# Patient Record
Sex: Male | Born: 1950 | Race: White | Hispanic: No | Marital: Single | Smoking: Never smoker
Health system: Southern US, Community
[De-identification: ages and names within clinical notes are randomized; demographics above are authoritative.]

## PROBLEM LIST (undated history)

## (undated) DIAGNOSIS — N200 Calculus of kidney: Secondary | ICD-10-CM

## (undated) DIAGNOSIS — I10 Essential (primary) hypertension: Secondary | ICD-10-CM

## (undated) DIAGNOSIS — K635 Polyp of colon: Secondary | ICD-10-CM

## (undated) DIAGNOSIS — K219 Gastro-esophageal reflux disease without esophagitis: Secondary | ICD-10-CM

## (undated) DIAGNOSIS — Q249 Congenital malformation of heart, unspecified: Secondary | ICD-10-CM

## (undated) DIAGNOSIS — F419 Anxiety disorder, unspecified: Secondary | ICD-10-CM

## (undated) DIAGNOSIS — G4730 Sleep apnea, unspecified: Secondary | ICD-10-CM

## (undated) DIAGNOSIS — Z87442 Personal history of urinary calculi: Secondary | ICD-10-CM

## (undated) HISTORY — DX: Calculus of kidney: N20.0

## (undated) HISTORY — DX: Anxiety disorder, unspecified: F41.9

## (undated) HISTORY — PX: NASAL SINUS SURGERY: SHX719

## (undated) HISTORY — DX: Gastro-esophageal reflux disease without esophagitis: K21.9

## (undated) HISTORY — DX: Sleep apnea, unspecified: G47.30

## (undated) HISTORY — DX: Polyp of colon: K63.5

## (undated) HISTORY — PX: OTHER SURGICAL HISTORY: SHX169

## (undated) HISTORY — PX: COLONOSCOPY: SHX174

---
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
## 2002-11-29 ENCOUNTER — Encounter: Payer: Self-pay | Admitting: Gastroenterology

## 2003-02-12 ENCOUNTER — Emergency Department (HOSPITAL_COMMUNITY): Admission: AD | Admit: 2003-02-12 | Discharge: 2003-02-12 | Payer: Self-pay | Admitting: Family Medicine

## 2004-07-26 ENCOUNTER — Ambulatory Visit: Payer: Self-pay | Admitting: Internal Medicine

## 2004-08-17 ENCOUNTER — Encounter: Payer: Self-pay | Admitting: Internal Medicine

## 2004-08-17 ENCOUNTER — Ambulatory Visit (HOSPITAL_BASED_OUTPATIENT_CLINIC_OR_DEPARTMENT_OTHER): Admission: RE | Admit: 2004-08-17 | Discharge: 2004-08-17 | Payer: Self-pay | Admitting: Internal Medicine

## 2004-08-23 ENCOUNTER — Ambulatory Visit: Payer: Self-pay | Admitting: Internal Medicine

## 2004-08-31 ENCOUNTER — Ambulatory Visit: Payer: Self-pay | Admitting: Internal Medicine

## 2004-09-28 ENCOUNTER — Emergency Department (HOSPITAL_COMMUNITY): Admission: EM | Admit: 2004-09-28 | Discharge: 2004-09-28 | Payer: Self-pay | Admitting: Family Medicine

## 2004-10-06 ENCOUNTER — Ambulatory Visit: Payer: Self-pay | Admitting: Internal Medicine

## 2006-03-17 ENCOUNTER — Encounter: Admission: RE | Admit: 2006-03-17 | Discharge: 2006-03-17 | Payer: Self-pay | Admitting: Internal Medicine

## 2006-12-04 ENCOUNTER — Emergency Department (HOSPITAL_COMMUNITY): Admission: EM | Admit: 2006-12-04 | Discharge: 2006-12-04 | Payer: Self-pay | Admitting: Family Medicine

## 2006-12-26 ENCOUNTER — Encounter: Admission: RE | Admit: 2006-12-26 | Discharge: 2006-12-26 | Payer: Self-pay | Admitting: Internal Medicine

## 2007-02-27 ENCOUNTER — Ambulatory Visit: Payer: Self-pay | Admitting: Gastroenterology

## 2007-03-12 ENCOUNTER — Encounter: Payer: Self-pay | Admitting: Gastroenterology

## 2007-03-12 ENCOUNTER — Ambulatory Visit: Payer: Self-pay | Admitting: Gastroenterology

## 2008-11-10 ENCOUNTER — Encounter: Admission: RE | Admit: 2008-11-10 | Discharge: 2008-11-10 | Payer: Self-pay | Admitting: Internal Medicine

## 2009-05-26 ENCOUNTER — Ambulatory Visit: Payer: Self-pay | Admitting: Internal Medicine

## 2009-05-26 DIAGNOSIS — G4700 Insomnia, unspecified: Secondary | ICD-10-CM

## 2009-05-26 DIAGNOSIS — J310 Chronic rhinitis: Secondary | ICD-10-CM

## 2009-05-26 DIAGNOSIS — G4733 Obstructive sleep apnea (adult) (pediatric): Secondary | ICD-10-CM | POA: Insufficient documentation

## 2009-05-26 HISTORY — DX: Obstructive sleep apnea (adult) (pediatric): G47.33

## 2009-05-26 HISTORY — DX: Insomnia, unspecified: G47.00

## 2009-05-26 HISTORY — DX: Chronic rhinitis: J31.0

## 2009-05-31 DIAGNOSIS — J309 Allergic rhinitis, unspecified: Secondary | ICD-10-CM

## 2009-05-31 HISTORY — DX: Allergic rhinitis, unspecified: J30.9

## 2009-06-07 ENCOUNTER — Encounter: Payer: Self-pay | Admitting: Internal Medicine

## 2009-07-04 ENCOUNTER — Telehealth: Payer: Self-pay | Admitting: Internal Medicine

## 2009-10-29 ENCOUNTER — Encounter: Admission: RE | Admit: 2009-10-29 | Discharge: 2009-10-29 | Payer: Self-pay | Admitting: Internal Medicine

## 2010-05-04 NOTE — Progress Notes (Signed)
Summary: [facility] autotitrated CPAP to 10  Phone Note Other Incoming   Summary of Call: CPAP autotitration from [facility] 2/227-06/07/09: 10 cwp for AHI 2.3/hr, Initial call taken by: Waymon Budge MD,  July 06, 2009 1:28 PM    New/Updated Medications: * CPAP 10 SLEEPMED

## 2010-05-04 NOTE — Assessment & Plan Note (Signed)
Summary: sleep problem/ mbw   Primary Provider/Referring Provider:  Dr. Elmore Guise  CC:  Sleep Consult.  Former pt.  wearing cpap everynight for approx 5-6 hours.  states he wakes up with mask off.  .  History of Present Illness: May 26, 2009- 58 yoM referred courtesy of Dr Elmore Guise for help with obstructive sleep apnea.  He was dx'd by NPSG 08/17/04, RDI 13.4/hr. CPAP of 7 corrected to AHI 0/hr.  He was last seen for f/u in 2007.  He got laid off. Has felt tired at night but fights going to bed. He denies daytime sleepiness, but admits he could doze off on cough. Dr Chilton Si gave triazolam for insomnia, used only occasionally.  He lives alone, with no feed-back about snoring, but he can't tell if cpap is making any difference. Bedtime 10-11 PM, Short latency with uncertain waking after sleep onset until up at 530AM.  Preventive Screening-Counseling & Management  Alcohol-Tobacco     Smoking Status: never  Current Medications (verified): 1)  Triazolam 0.25 Mg Tabs (Triazolam) .... At Bedtime As Needed  Allergies (verified): 1)  ! Pcn  Past History:  Family History: Last updated: 05/26/2009 father-emphysema father-asthma  Social History: Last updated: 05/26/2009 Patient never smoked.  no alcohol divorced 1 son lives alon Unemployed at present  Risk Factors: Smoking Status: never (05/26/2009)  Past Medical History: OBSTRUCTIVE SLEEP APNEA (ICD-327.23)- NPSG 08/17/04 RDI 13.4/hr Allergic Rhinitis  Past Surgical History: sinus surgery/ UPPP Nasal polypectomy x 2 Appendectomy  Family History: father-emphysema father-asthma  Social History: Patient never smoked.  no alcohol divorced 1 son lives alon Unemployed at presentSmoking Status:  never  Review of Systems      See HPI       The patient complains of nasal congestion/difficulty breathing through nose.  The patient denies shortness of breath with activity, shortness of breath at rest, productive cough,  non-productive cough, coughing up blood, chest pain, irregular heartbeats, acid heartburn, indigestion, loss of appetite, weight change, abdominal pain, difficulty swallowing, sore throat, tooth/dental problems, headaches, sneezing, itching, ear ache, anxiety, depression, hand/feet swelling, joint stiffness or pain, rash, change in color of mucus, and fever.    Vital Signs:  Patient profile:   60 year old male Height:      73 inches Weight:      208.50 pounds BMI:     27.61 O2 Sat:      96 % on Room air Pulse rate:   96 / minute BP sitting:   140 / 86  (right arm) Cuff size:   regular  Vitals Entered By: Gweneth Dimitri RN (May 26, 2009 4:22 PM)  O2 Flow:  Room air CC: Sleep Consult.  Former pt.  wearing cpap everynight for approx 5-6 hours.  states he wakes up with mask off.   Comments Medications reviewed with patient Daytime contact number verified with patient. Gweneth Dimitri RN  May 26, 2009 4:22 PM    Physical Exam  Additional Exam:  General: A/Ox3; pleasant and cooperative, NAD, trim, calm SKIN: no rash, lesions NODES: no lymphadenopathy HEENT: [place]/AT, EOM- WNL, Conjuctivae- clear, PERRLA, TM-WNL, Nose- narrow left side, no polyps, Throat- clear, s/p UPPP NECK: Supple w/ fair ROM, JVD- none, normal carotid impulses w/o bruits Thyroid- normal to palpation CHEST: Clear to P&A HEART: RRR, no m/g/r heard ABDOMEN: Soft and nl;  MWU:XLKG, nl pulses, no edema  NEURO: Grossly intact to observation      Impression & Recommendations:  Problem # 1:  OBSTRUCTIVE  SLEEP APNEA (ICD-327.23)  He was mild originally, remains trim and has had a UPPP. Aware from girlfriend that he does still snore, but not noting daytime sleepiness. We can get autotitration as a guide and have DME look at his machine and humidifier.  Problem # 2:  INSOMNIA (ICD-780.52) OK to continue triuazolam from Dr Chilton Si for now, but I've introduced cognitive behavioral therapy. His updated medication  list for this problem includes:    Triazolam 0.25 Mg Tabs (Triazolam) .Marland Kitchen... At bedtime as needed  Problem # 3:  RHINITIS (ICD-472.0) We will sample nasonex. I don't see polyps now.  Medications Added to Medication List This Visit: 1)  Triazolam 0.25 Mg Tabs (Triazolam) .... At bedtime as needed  Other Orders: New Patient Level IV (47829) DME Referral (DME)  Patient Instructions: 1)  Please schedule a follow-up appointment in 1 month. 2)  See [facility] to set up CPAP autotitration 3)  Consider cognitive behavioral therapy web site 4)       [email or web address] 5)  Triazolam ok for now 6)  Sample Nasonex nasal spray- 2 puffs each nostril every night.   Immunization History:  Influenza Immunization History:    Influenza:  historical (01/02/2009)

## 2010-08-20 NOTE — Procedures (Signed)
NAME:  Ricardo Morris, Ricardo Morris                  ACCOUNT NO.:  [id number]   MEDICAL RECORD NO.:  [id number]          PATIENT TYPE:  OUT   LOCATION:  SLEEP CENTER                 FACILITY:  [facility]   PHYSICIAN:  Clinton D. Maple Hudson, M.D. DATE OF BIRTH:  Sep 30, 1950   DATE OF STUDY:  08/17/2004                              NOCTURNAL POLYSOMNOGRAM   REFERRING PHYSICIAN:  Dr. Jetty Duhamel   STUDY DATE:  Aug 17, 2004   INDICATION FOR STUDY:  Insomnia with sleep apnea.  Epworth Sleepiness Score  6/24, BMI 26, weight 200 pounds.   SLEEP ARCHITECTURE:  Total sleep time 453 minutes with sleep efficiency 86%.  Stage I was 3%, stage II 62%, stages III and IV were 4% and REM was 31% of  total sleep time.  Sleep latency 25 minutes, REM latency 141 minutes, awake  after sleep onset 42 minutes, arousal index 9.   RESPIRATORY DATA:  Split-study protocol.  Respiratory disturbance index  (RDI, AHI) 13.4 obstructive events per hour indicating mild obstructive  sleep apnea/hypopnea syndrome before CPAP.  There were 10 obstructive  apneas, 1 central apnea and 21 hypopneas before CPAP.  All events and almost  all initial sleep before application of CPAP were recorded while sleeping  supine.   REM RDI 0.  CPAP was titrated to 7 CWP, RDI 0 per hour using a full face  Ultra Mirage Mask with heated humidifier.   OXYGEN DATA:  Mild snoring with oxygen desaturation to a nadir of 89% before  CPAP.  After CPAP control saturation held 95-98% on room air.   CARDIAC DATA:  Normal sinus rhythm.   MOVEMENT/PARASOMNIA:  A total of 322 limb jerks were recorded of which 9  were associated with arousal or awakening for a periodic limb movement with  arousal of 1.2 per hour.   IMPRESSION/RECOMMENDATION:  1.  Mild obstructive sleep apnea/hypopnea syndrome, respiratory disturbance      index 3.4 per hour with mild snoring and oxygen desaturation to 89%.  2.  Successful continuous positive airway pressure titration to 7 CWP,  respiratory disturbance index 0 per hour.  A full face Ultra Mirage mask      was used with heated humidifier.  3.  Periodic limb movement with arousal.  On this study most limb jerks were      not specifically associated with      arousal event but this degree of leg jerking strongly suggests this may      be a clinically significant problem at home.      CDY/MEDQ  D:  08/22/2004 11:12:06  T:  08/22/2004 13:03:32  Job:  [id number]

## 2012-02-27 ENCOUNTER — Encounter: Payer: Self-pay | Admitting: Gastroenterology

## 2012-04-24 ENCOUNTER — Encounter: Payer: Self-pay | Admitting: Gastroenterology

## 2012-05-14 ENCOUNTER — Other Ambulatory Visit: Payer: Self-pay | Admitting: Dermatology

## 2012-06-11 ENCOUNTER — Ambulatory Visit (AMBULATORY_SURGERY_CENTER): Payer: BC Managed Care – PPO | Admitting: *Deleted

## 2012-06-11 VITALS — Ht 73.0 in | Wt 210.0 lb

## 2012-06-11 DIAGNOSIS — Z1211 Encounter for screening for malignant neoplasm of colon: Secondary | ICD-10-CM

## 2012-06-11 MED ORDER — SUPREP BOWEL PREP KIT 17.5-3.13-1.6 GM/177ML PO SOLN: ORAL | Status: DC

## 2012-06-12 ENCOUNTER — Encounter: Payer: Self-pay | Admitting: Gastroenterology

## 2012-06-18 ENCOUNTER — Telehealth: Payer: Self-pay | Admitting: Gastroenterology

## 2012-06-18 NOTE — Telephone Encounter (Signed)
Spoke with patient, he denies any fever just having sinus drainage. Explained to patient that he could still have colonoscopy unless he has fever. He understands. He wants to wait over the next few days to see if he improves before he cancels procedure. He will call us back if needed.

## 2012-06-25 ENCOUNTER — Ambulatory Visit (AMBULATORY_SURGERY_CENTER): Payer: BC Managed Care – PPO | Admitting: Gastroenterology

## 2012-06-25 ENCOUNTER — Encounter: Payer: Self-pay | Admitting: Gastroenterology

## 2012-06-25 VITALS — BP 126/76 | HR 68 | Temp 97.9°F | Resp 9 | Ht 73.0 in | Wt 210.0 lb

## 2012-06-25 DIAGNOSIS — Z1211 Encounter for screening for malignant neoplasm of colon: Secondary | ICD-10-CM

## 2012-06-25 DIAGNOSIS — K5730 Diverticulosis of large intestine without perforation or abscess without bleeding: Secondary | ICD-10-CM

## 2012-06-25 MED ORDER — SODIUM CHLORIDE 0.9 % IV SOLN: 500.0000 mL | INTRAVENOUS | Status: DC

## 2012-06-25 NOTE — Patient Instructions (Addendum)
YOU HAD AN ENDOSCOPIC PROCEDURE TODAY AT THE [facility]: Refer to the procedure report that was given to you for any specific questions about what was found during the examination.  If the procedure report does not answer your questions, please call your gastroenterologist to clarify.  If you requested that your care partner not be given the details of your procedure findings, then the procedure report has been included in a sealed envelope for you to review at your convenience later.  YOU SHOULD EXPECT: Some feelings of bloating in the abdomen. Passage of more gas than usual.  Walking can help get rid of the air that was put into your GI tract during the procedure and reduce the bloating. If you had a lower endoscopy (such as a colonoscopy or flexible sigmoidoscopy) you may notice spotting of blood in your stool or on the toilet paper. If you underwent a bowel prep for your procedure, then you may not have a normal bowel movement for a few days.  DIET: Your first meal following the procedure should be a light meal and then it is ok to progress to your normal diet.  A half-sandwich or bowl of soup is an example of a good first meal.  Heavy or fried foods are harder to digest and may make you feel nauseous or bloated.  Likewise meals heavy in dairy and vegetables can cause extra gas to form and this can also increase the bloating.  Drink plenty of fluids but you should avoid alcoholic beverages for 24 hours.  ACTIVITY: Your care partner should take you home directly after the procedure.  You should plan to take it easy, moving slowly for the rest of the day.  You can resume normal activity the day after the procedure however you should NOT DRIVE or use heavy machinery for 24 hours (because of the sedation medicines used during the test).    SYMPTOMS TO REPORT IMMEDIATELY: A gastroenterologist can be reached at any hour.  During normal business hours, 8:30 AM to 5:00 PM Monday through Friday,  call [phone].  After hours and on weekends, please call the GI answering service at [phone] who will take a message and have the physician on call contact you.   Following lower endoscopy (colonoscopy or flexible sigmoidoscopy):  Excessive amounts of blood in the stool  Significant tenderness or worsening of abdominal pains  Swelling of the abdomen that is new, acute  Fever of 100F or higher  FOLLOW UP: Our staff will call the home number listed on your records the next business day following your procedure to check on you and address any questions or concerns that you may have at that time regarding the information given to you following your procedure. This is a courtesy call and so if there is no answer at the home number and we have not heard from you through the emergency physician on call, we will assume that you have returned to your regular daily activities without incident.  SIGNATURES/CONFIDENTIALITY: You and/or your care partner have signed paperwork which will be entered into your electronic medical record.  These signatures attest to the fact that that the information above on your After Visit Summary has been reviewed and is understood.  Full responsibility of the confidentiality of this discharge information lies with you and/or your care-partner.  Please continue your normal medications  Please read information about diverticulosis and high fiber diets

## 2012-06-25 NOTE — Op Note (Signed)
[facility] [address] [place], [address]   COLONOSCOPY PROCEDURE REPORT  PATIENT: Ricardo Morris, Ricardo Morris  MR#: [id number] BIRTHDATE: [birth year] , 61  yrs. old GENDER: Male ENDOSCOPIST: Louis Meckel, MD REFERRED AO:ZHYQM Chilton Si, M.D. PROCEDURE DATE:  06/25/2012 PROCEDURE:   Colonoscopy, diagnostic ASA CLASS:   Class II INDICATIONS: MEDICATIONS: MAC sedation, administered by CRNA and Propofol (Diprivan) 180 mg IV  DESCRIPTION OF PROCEDURE:   After the risks benefits and alternatives of the procedure were thoroughly explained, informed consent was obtained.  A digital rectal exam revealed external hemorrhoids.   The LB [id number]  endoscope was introduced through the anus and advanced to the cecum, which was identified by both the appendix and ileocecal valve. No adverse events experienced.   The quality of the prep was Suprep excellent  The instrument was then slowly withdrawn as the colon was fully examined.      COLON FINDINGS: Moderate diverticulosis was noted in the descending colon.   Mild diverticulosis was noted in the sigmoid colon.   The colon mucosa was otherwise normal.  Retroflexed views revealed no abnormalities. The time to cecum=3 minutes 19 seconds.  Withdrawal time=7 minutes 42 seconds.  The scope was withdrawn and the procedure completed. COMPLICATIONS: There were no complications.  ENDOSCOPIC IMPRESSION: 1.   Moderate diverticulosis was noted in the descending colon 2.   Mild diverticulosis was noted in the sigmoid colon 3.   The colon mucosa was otherwise normal  RECOMMENDATIONS: Continue current colorectal screening recommendations for "routine risk" patients with a repeat colonoscopy in 10 years.   eSigned:  Louis Meckel, MD 06/25/2012 12:24 PM   cc:

## 2012-06-25 NOTE — Progress Notes (Signed)
Patient did not experience any of the following events: a burn prior to discharge; a fall within the facility; wrong site/side/patient/procedure/implant event; or a hospital transfer or hospital admission upon discharge from the facility. (G8907) Patient did not have preoperative order for IV antibiotic SSI prophylaxis. (G8918)  

## 2012-06-26 ENCOUNTER — Telehealth: Payer: Self-pay

## 2012-06-26 NOTE — Telephone Encounter (Signed)
  Follow up Call-  Call back number 06/25/2012  Post procedure Call Back phone  # [phone]  Permission to leave phone message Yes     Patient questions:  Do you have a fever, pain , or abdominal swelling? no Pain Score  0 *  Have you tolerated food without any problems? yes  Have you been able to return to your normal activities? yes  Do you have any questions about your discharge instructions: Diet   no Medications  no Follow up visit  no  Do you have questions or concerns about your Care? no  Actions: * If pain score is 4 or above: No action needed, pain <4.  No problems per the pt. Maw

## 2012-12-09 ENCOUNTER — Emergency Department (HOSPITAL_COMMUNITY)
Admission: EM | Admit: 2012-12-09 | Discharge: 2012-12-09 | Disposition: A | Discharge status: Home / Self Care | Payer: BC Managed Care – PPO | Source: Home / Self Care | Attending: Emergency Medicine | Admitting: Emergency Medicine

## 2012-12-09 ENCOUNTER — Encounter (HOSPITAL_COMMUNITY): Payer: Self-pay | Admitting: *Deleted

## 2012-12-09 DIAGNOSIS — J029 Acute pharyngitis, unspecified: Secondary | ICD-10-CM

## 2012-12-09 MED ORDER — NAPROXEN 500 MG PO TABS: 500.0000 mg | ORAL_TABLET | Freq: Two times a day (BID) | ORAL | Status: DC

## 2012-12-09 NOTE — ED Notes (Signed)
Pt  Reports  symptopms  Of  sorethroat  Swollen  Neck  Glands   And  Mild  Pain  On  Swallowing   Reports    He  Had  teeth  Cleaned  Last  Week  But  Has  No  Pain

## 2012-12-09 NOTE — ED Provider Notes (Signed)
Chief Complaint:   Chief Complaint  Patient presents with  . Sore Throat    History of Present Illness:   Ricardo Morris is a 62 year old male who has had a five-day history that began with some GI symptoms with gas and diarrhea. He denies any nausea, vomiting, or stomach pain or cramping. This was followed by an irritated feeling in his throat, his neck was sore and possibly swollen. He had some headache, some sneezing, and some postnasal drip. He denies fever, chills, purulent nasal drainage, coughing or wheezing. He has not been exposed to anything in particular.  Review of Systems:  Other than as noted above, the patient denies any of the following symptoms. Systemic:  No fever, chills, sweats, fatigue, myalgias, headache, or anorexia. Eye:  No redness, pain or drainage. ENT:  No earache, ear congestion, nasal congestion, sneezing, rhinorrhea, sinus pressure, sinus pain, or post nasal drip. Lungs:  No cough, sputum production, wheezing, shortness of breath, or chest pain. GI:  No abdominal pain, nausea, vomiting, or diarrhea. Skin:  No rash or itching.  PMFSH:  Past medical history, family history, social history, meds, allergies, and nurse's notes were reviewed.  There is no known exposure to strep or mono.  No prior history of step or mono.  The patient denies use of tobacco. He is allergic to penicillin. He takes Protonix and calcium for sleep.  Physical Exam:   Vital signs:  BP 167/86  Pulse 78  Temp(Src) 98.6 F (37 C) (Oral)  Resp 18  SpO2 99% General:  Alert, in no distress. Eye:  No conjunctival injection or drainage. Lids were normal. ENT:  TMs and canals were normal, without erythema or inflammation.  Nasal mucosa was clear and uncongested, without drainage.  Mucous membranes were moist.  Exam of pharynx he is status post palatoplasty. His pharynx looks completely clear, no erythema, exudate, or ulcerations.  There were no oral ulcerations or lesions. Neck:  Supple, no  adenopathy, tenderness or mass. Lungs:  No respiratory distress.  Lungs were clear to auscultation, without wheezes, rales or rhonchi.  Breath sounds were clear and equal bilaterally.  Heart:  Regular rhythm, without gallops, murmers or rubs. Skin:  Clear, warm, and dry, without rash or lesions.  Labs:   Results for orders placed during the hospital encounter of 12/09/12  POCT RAPID STREP A ([facility]RG CARE ONLY)      Result Value Range   Streptococcus, Group A Screen (Direct) NEGATIVE  NEGATIVE   Assessment:  The encounter diagnosis was Viral pharyngitis.  No indication for antibiotics.  Plan:   1.  The following meds were prescribed:   Discharge Medication List as of 12/09/2012 10:40 AM    START taking these medications   Details  naproxen (NAPROSYN) 500 MG tablet Take 1 tablet (500 mg total) by mouth 2 (two) times daily., Starting 12/09/2012, Until Discontinued, Normal       2.  The patient was instructed in symptomatic care including hot saline gargles, throat lozenges, infectious precautions, and need to trade out toothbrush. Handouts were given. 3.  The patient was told to return if becoming worse in any way, if no better in 3 or 4 days, and given some red flag symptoms such as difficulty swallowing or breathing that would indicate earlier return. 4.  Follow up here if necessary. Advised to take infectious precautions.    Reuben Likes, MD 12/09/12 [phone]

## 2012-12-11 LAB — CULTURE, GROUP A STREP

## 2013-01-16 ENCOUNTER — Telehealth: Payer: Self-pay | Admitting: Internal Medicine

## 2013-01-16 NOTE — Telephone Encounter (Signed)
Spoke with patient-states she has an old machine and was wondering how to go about getting a new one (if possible). I explained to patient that he has not been seen in well over 3 years and needs to re establish with [facility] for this. Pt states he will call his insurance company first to see what they say and go from there. Pt will call back if he decides to make consult appointment with CY.

## 2013-01-29 ENCOUNTER — Telehealth: Payer: Self-pay | Admitting: Pulmonary Disease

## 2013-01-29 NOTE — Telephone Encounter (Signed)
I spoke with pt. He reports he scheduled an appt to see [facility] 03/08/13. He last saw CDY 2011. He has CPAP machine in 2006. He does not have a chip in it for download. He reports he has a strange taste in his mouth and lips seems to feel slight puffy. He changed tooth paste thinking it could have been that. He was told to check pressure on CPAP machine but of course he can't do that bc he does not have a chip in his machine. He reports d/t insurance before they will pay he needs OV showing compliance. He is going to call back daily to see if we have any cancellations. Nothing further needed

## 2013-01-31 ENCOUNTER — Ambulatory Visit (INDEPENDENT_AMBULATORY_CARE_PROVIDER_SITE_OTHER): Payer: BC Managed Care – PPO | Admitting: Pulmonary Disease

## 2013-01-31 ENCOUNTER — Encounter: Payer: Self-pay | Admitting: Pulmonary Disease

## 2013-01-31 VITALS — BP 150/80 | HR 80 | Temp 98.4°F | Ht 72.0 in | Wt 206.4 lb

## 2013-01-31 DIAGNOSIS — G4733 Obstructive sleep apnea (adult) (pediatric): Secondary | ICD-10-CM

## 2013-01-31 NOTE — Patient Instructions (Signed)
CPAP supplies Detailed download to check pressure

## 2013-01-31 NOTE — Assessment & Plan Note (Signed)
CPAP supplies Detailed download to check pressure  Weight loss encouraged, compliance with goal of at least 4-6 hrs every night is the expectation. Advised against medications with sedative side effects Cautioned against driving when sleepy - understanding that sleepiness will vary on a day to day basis

## 2013-01-31 NOTE — Progress Notes (Signed)
  Subjective:    Patient ID: Ricardo Morris, male    DOB: 11-04-1950, 62 y.o.   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 He presented with loud snoring and daytime fatigue in spite of undergoing a deviated septum repair and UPPP by ENT. Overnight polysomnogram in 2006 showed RDI of 13 events per hour corrected by CPAP of 7 cm with a full face mask and lowest desaturation of 89%. He obtain his CPAP- S8 escape-  from sleep med and has been compliant, has the same machine for 8 yrs. He has not changed to being hard the mask and more than a year. He complains of some dryness and sores on his lips and wonders if the CPAP may be causing this and whether he needs to change his machine because of this. Bedtime is around 11 PM, he has been on triazolam for sleep onset insomnia for several years, denies nocturnal awakenings and is out of bed at 6 AM feeling rested without headaches. His feet is unchanged since 2006. Sleepiness score is 3/24 Six-month download shows good compliance     Past Medical History  Diagnosis Date  . GERD (gastroesophageal reflux disease)   . Sleep apnea     uses c-pap    Past Surgical History  Procedure Laterality Date  . Appendectomy  1956  . Nasal sinus surgery      Allergies  Allergen Reactions  . Penicillins     As child   History   Social History  . Marital Status: Single    Spouse Name: N/A    Number of Children: 1  . Years of Education: N/A   Occupational History  . dental lab tech    Social History Main Topics  . Smoking status: Never Smoker   . Smokeless tobacco: Never Used  . Alcohol Use: No  . Drug Use: No  . Sexual Activity: Not on file   Other Topics Concern  . Not on file   Social History Narrative  . No narrative on file    Family History  Problem Relation Age of Onset  . Asthma Father   . Emphysema Father      Review of Systems  Constitutional: Negative for  fever and unexpected weight change.  HENT: Positive for congestion. Negative for dental problem, ear pain, nosebleeds, postnasal drip, rhinorrhea, sinus pressure, sneezing, sore throat and trouble swallowing.   Eyes: Negative for redness and itching.  Respiratory: Negative for cough, chest tightness, shortness of breath and wheezing.   Cardiovascular: Negative for palpitations and leg swelling.  Gastrointestinal: Negative for nausea and vomiting.  Genitourinary: Negative for dysuria.  Musculoskeletal: Negative for joint swelling.  Skin: Negative for rash.  Neurological: Negative for headaches.  Hematological: Does not bruise/bleed easily.  Psychiatric/Behavioral: Negative for dysphoric mood. The patient is not nervous/anxious.        Objective:   Physical Exam  Gen. Pleasant, well-nourished, in no distress, normal affect ENT - no lesions, no post nasal drip Neck: No JVD, no thyromegaly, no carotid bruits Lungs: no use of accessory muscles, no dullness to percussion, clear without rales or rhonchi  Cardiovascular: Rhythm regular, heart sounds  normal, no murmurs or gallops, no peripheral edema Abdomen: soft and non-tender, no hepatosplenomegaly, BS normal. Musculoskeletal: No deformities, no cyanosis or clubbing Neuro:  alert, non focal       Assessment & Plan:

## 2013-02-07 ENCOUNTER — Telehealth: Payer: Self-pay | Admitting: Pulmonary Disease

## 2013-02-07 NOTE — Telephone Encounter (Signed)
Per last phone note: Staff message given to [facility] with [facility]. Dr. Vassie Loll wants [facility] to try to obtain detailed download with pressures and AHI's on report. If unable to obtain this information, then patient will need a Auto CPAP. Called and explained this to patient. Pt is aware of process.   Patient is requesting to speak to [facility]. He states [facility] has called him twice and the information has been different; patient wanting to discuss this.  [phone]

## 2013-02-07 NOTE — Telephone Encounter (Signed)
Called and spoke with patient. Pt was concerned b/c when [facility] contacted him x 2 [facility] kept trying to provide cpap supplies but not mentioning the download of pt's device. [facility] did contact the patient back before I spoke with him and pt is to bring the device in to the [employer] and see if the detailed information that Dr. Vassie Loll requested can be obtained off his machine, if not, [facility] will provide patient with the loaner to obtain the needed information requested.  Advised patient to contact me back if there is any additional issues. Nothing else needed at this point. Rhonda J Cobb

## 2013-02-18 ENCOUNTER — Telehealth: Payer: Self-pay | Admitting: Pulmonary Disease

## 2013-02-18 NOTE — Telephone Encounter (Signed)
lmomtcb x1 for pt-- Download received and will give to RA tomorrow

## 2013-02-18 NOTE — Telephone Encounter (Signed)
Pt aware.

## 2013-02-20 ENCOUNTER — Telehealth: Payer: Self-pay | Admitting: Pulmonary Disease

## 2013-02-20 ENCOUNTER — Telehealth: Payer: Self-pay | Admitting: Gastroenterology

## 2013-02-20 DIAGNOSIS — G4733 Obstructive sleep apnea (adult) (pediatric): Secondary | ICD-10-CM

## 2013-02-20 NOTE — Telephone Encounter (Signed)
Pt states he has been having problems with a metallic/salty taste in his mouth. Pt has seen his PCP and his Dentist and did not know what to do next. Requested to be seen. Pt scheduled to see Mike Gip PA tomorrow at 2:30pm. Pt aware of appt.

## 2013-02-20 NOTE — Telephone Encounter (Signed)
Download does not give pressure information Can [facility] check his CPAP for pressure info - studys howed 7 cm Otherwise may need loaner

## 2013-02-21 ENCOUNTER — Encounter: Payer: Self-pay | Admitting: Physician Assistant

## 2013-02-21 ENCOUNTER — Ambulatory Visit (INDEPENDENT_AMBULATORY_CARE_PROVIDER_SITE_OTHER): Payer: BC Managed Care – PPO | Admitting: Physician Assistant

## 2013-02-21 VITALS — BP 140/82 | HR 92 | Ht 73.0 in | Wt 207.2 lb

## 2013-02-21 DIAGNOSIS — R439 Unspecified disturbances of smell and taste: Secondary | ICD-10-CM

## 2013-02-21 DIAGNOSIS — K219 Gastro-esophageal reflux disease without esophagitis: Secondary | ICD-10-CM

## 2013-02-21 DIAGNOSIS — K5730 Diverticulosis of large intestine without perforation or abscess without bleeding: Secondary | ICD-10-CM | POA: Insufficient documentation

## 2013-02-21 DIAGNOSIS — R432 Parageusia: Secondary | ICD-10-CM

## 2013-02-21 HISTORY — DX: Diverticulosis of large intestine without perforation or abscess without bleeding: K57.30

## 2013-02-21 MED ORDER — SACCHAROMYCES BOULARDII 250 MG PO CAPS: ORAL_CAPSULE | ORAL | Status: DC

## 2013-02-21 MED ORDER — NYSTATIN 100000 UNIT/ML MT SUSP: OROMUCOSAL | Status: DC

## 2013-02-21 NOTE — Telephone Encounter (Signed)
Order has been placed and will send message to Ricardo Morris so she is aware

## 2013-02-21 NOTE — Telephone Encounter (Signed)
I spoke with pt. He reports his machine has no way to check the pressure settings on his machine. He reports last time he needed pressure settings recs he had to get a loaner. Dr. Vassie Loll do you want this to be auto 5-20 cm? thanks

## 2013-02-21 NOTE — Telephone Encounter (Signed)
Auto 5-12 cm

## 2013-02-21 NOTE — Patient Instructions (Addendum)
We sent a prescription to your pharmacy for Mycostatin  Liquid . You will take a small amount (5 cc's)  and swish and swallow for 10 days. Take 1 probiotic for of Florastor. We gave you a few samples.  We sent a prescription to the pharmacy. Take the probiotic for a month.   Try benefiber every morning in juice or water. You can get that at any pharmacy, [facility], [address], [employer]. Follow up with Dr. Arlyce Dice.

## 2013-02-21 NOTE — Progress Notes (Signed)
Subjective:    Patient ID: Ricardo Morris, male    DOB: 08-20-1950, 62 y.o.   MRN: [id number]  HPI  T[place] is a 62 year old male known to Dr. Arlyce Dice from prior colonoscopies. He last had colonoscopy in March of 2014 which was negative with the exception of moderate diverticulosis. He comes in today with primary complaint of an Augmentin taste in his mouth. He says this started about 5 or 6 weeks ago and has been persistent. He says over the past couple of weeks this has become more salty in taste and not quite as bad. He denies any soreness in his mouth or altered taste sensation. His appetite has been fine his weight has been stable. He has no dysphagia or odynophagia. He does have occasional reflux symptoms with indigestion and rare belching. He is not aware of any regurgitation or sour brash. He has been taking Protonix once daily over the past year or so. He has not had any recent antibiotics. He is very concerned about the metallic/salty taste sensation and has seen a couple of physicians and says he will go to ENT next. He also has some minor issues with his bowel habits with some mild irregularity.    Review of Systems  Constitutional: Negative.   HENT: Negative.   Eyes: Negative.   Respiratory: Negative.   Cardiovascular: Negative.   Gastrointestinal: Negative.   Endocrine: Negative.   Genitourinary: Negative.   Musculoskeletal: Negative.   Allergic/Immunologic: Negative.   Neurological: Negative.   Hematological: Negative.   Psychiatric/Behavioral: Negative.    Outpatient Prescriptions Prior to Visit  Medication Sig Dispense Refill  . triazolam (HALCION) 0.25 MG tablet Take 0.25 mg by mouth at bedtime and may repeat dose one time if needed.      . naproxen (NAPROSYN) 500 MG tablet Take 500 mg by mouth as needed.      . Pantoprazole Sodium (PROTONIX PO) Take 1 tablet by mouth daily.       No facility-administered medications prior to visit.       Allergies  Allergen Reactions   . Penicillins     As child   Patient Active Problem List   Diagnosis Date Noted  . ALLERGIC RHINITIS 05/31/2009  . OBSTRUCTIVE SLEEP APNEA 05/26/2009  . RHINITIS 05/26/2009  . INSOMNIA 05/26/2009   History  Substance Use Topics  . Smoking status: Never Smoker   . Smokeless tobacco: Never Used  . Alcohol Use: No   family history includes Asthma in his father; Emphysema in his father. There is no history of Pancreatic cancer, Colon cancer, Throat cancer, Stomach cancer, Heart disease, Diabetes, Kidney disease, or Liver disease.  Objective:   Physical Exam Well-developed white male in no acute distress, blood pressure 140/82 pulse 92 height 6 foot 1 weight 207. HEENT nontraumatic normocephalic EOMI PERRLA sclera anicteric, oropharynx appears clear there are no plaques Supple no JVD, Cardiovascular regular rate and rhythm with S1-S2 no murmur or gallop, Ulnar clear bilaterally, Abdomen soft nontender nondistended no palpable mass or hepatosplenomegaly, Extremities no clubbing cyanosis or edema skin warm and dry, Psych mood and affect normal and appropriate       Assessment & Plan:  #32  62 year old male with new metallic/salty taste in his mouth x5-6 weeks of unclear etiology. This may be related to medications though nothing new on his regimen, question related to GERD though no other symptoms consistent with uncontrolled GERD. Rule out mild candidiasis #2 diverticulosis  Plan; continue Protonix 40 mg but switched to  every morning We'll give him a trial of nystatin oral suspension 5 cc 4 times daily swish and swallow x14 days Start probiotic once daily-samples of Florastor given today Suggested Benefiber on a daily basis and liberalizing his fluid consumption for mild irregularity He will follow up with Dr. Arlyce Dice as needed and if the above regimen is not helpful may seek ENT advice.

## 2013-02-22 NOTE — Progress Notes (Signed)
Reviewed and agree with management. Ariani Seier D. Shawntell Dixson, M.D., FACG  

## 2013-03-04 ENCOUNTER — Telehealth: Payer: Self-pay | Admitting: Physician Assistant

## 2013-03-04 NOTE — Telephone Encounter (Signed)
Patient continues to have salty, metallic to sour taste in mouth. Has been taking Nystatin. For the last two weeks, he has had a lot of heartburn and feels like needs to burp. He is taking Protonix. He went to ENT and could not find anything. Offered Keflex but he has not taken. He took Reglan and Zegerid that his friend had on hand.Discussed with patient that he should not take someone else's medications.  He is asking if he can try something besides the Protonix.

## 2013-03-05 ENCOUNTER — Telehealth: Payer: Self-pay | Admitting: *Deleted

## 2013-03-05 MED ORDER — DEXLANSOPRAZOLE 60 MG PO CPDR: DELAYED_RELEASE_CAPSULE | ORAL | Status: DC

## 2013-03-05 NOTE — Telephone Encounter (Signed)
Spoke with patient and gave him Mike Gip, [place] recommendation. Dexilant up front for pick up.

## 2013-03-05 NOTE — Telephone Encounter (Signed)
Patient calling again. Asking what he needs to do. He is going out of town.

## 2013-03-05 NOTE — Telephone Encounter (Signed)
He may come by and get samples of Dexilant  Which is stronger than protonx... Just take once daily in am -give him enough for a couple weeks for reflux- if helpful can call in RX... May not help the salty taste.. As not sure what is causing that.

## 2013-03-08 ENCOUNTER — Telehealth: Payer: Self-pay | Admitting: Physician Assistant

## 2013-03-08 ENCOUNTER — Institutional Professional Consult (permissible substitution): Payer: BC Managed Care – PPO | Admitting: Pulmonary Disease

## 2013-03-08 NOTE — Telephone Encounter (Signed)
Patient calling to let us know that he started the Dexilant several days. He states the left side of his face is puffy. He is not sure if this started before he took Dexilant or not. He thought it was sinus problems and has been taken a decongestant. He states he felt a little light headed today too. He states the Dexilant seems to have helped the pressure he had, his chest is not hurting. He is burping a little. He feels full and not very hungry. Patient may stop Dexilant and see if symptoms stop. He is will call us back if needed.

## 2013-03-08 NOTE — Telephone Encounter (Signed)
ok 

## 2013-03-18 ENCOUNTER — Telehealth: Payer: Self-pay | Admitting: Physician Assistant

## 2013-03-18 MED ORDER — DEXLANSOPRAZOLE 60 MG PO CPDR: 60.0000 mg | DELAYED_RELEASE_CAPSULE | Freq: Every day | ORAL | Status: DC

## 2013-03-18 NOTE — Telephone Encounter (Signed)
Spoke with patient and per telephone note from 03/05/13, if Dexilant is helping may send rx. He states he would like to continue taking it because he is some better.

## 2013-03-19 ENCOUNTER — Telehealth: Payer: Self-pay | Admitting: *Deleted

## 2013-03-19 NOTE — Telephone Encounter (Signed)
Left a message for patient to call me. 

## 2013-03-19 NOTE — Telephone Encounter (Signed)
Message copied by Daphine Deutscher on Tue Mar 19, 2013 10:51 AM ------      Message from: Roda Shutters      Created: Tue Mar 19, 2013 10:04 AM       Pt is requesting samples for dexilant. He says its going to cost him $70 and would rather wait until January so that it will apply towards his DED.  ------

## 2013-03-19 NOTE — Telephone Encounter (Signed)
Spoke with patient and left a coupon card for Dexilant up front for pick up by patient.

## 2013-04-15 ENCOUNTER — Ambulatory Visit: Payer: BC Managed Care – PPO | Admitting: Gastroenterology

## 2013-04-17 ENCOUNTER — Telehealth: Payer: Self-pay | Admitting: Gastroenterology

## 2013-04-17 NOTE — Telephone Encounter (Signed)
Pt scheduled to see Dr. Deatra Ina tomorrow  morning at 10:30am. Pt aware of appt date and time.

## 2013-04-18 ENCOUNTER — Ambulatory Visit (INDEPENDENT_AMBULATORY_CARE_PROVIDER_SITE_OTHER): Payer: BC Managed Care – PPO | Admitting: Gastroenterology

## 2013-04-18 ENCOUNTER — Encounter: Payer: Self-pay | Admitting: Gastroenterology

## 2013-04-18 VITALS — BP 114/70 | HR 68 | Ht 71.0 in | Wt 199.2 lb

## 2013-04-18 DIAGNOSIS — K3189 Other diseases of stomach and duodenum: Secondary | ICD-10-CM

## 2013-04-18 DIAGNOSIS — R1013 Epigastric pain: Secondary | ICD-10-CM

## 2013-04-18 HISTORY — DX: Other diseases of stomach and duodenum: K31.89

## 2013-04-18 HISTORY — DX: Epigastric pain: R10.13

## 2013-04-18 MED ORDER — DEXLANSOPRAZOLE 60 MG PO CPDR: 60.0000 mg | DELAYED_RELEASE_CAPSULE | Freq: Every day | ORAL | Status: DC

## 2013-04-18 NOTE — Progress Notes (Signed)
          History of Present Illness:  The patient continues to complain of some sourness in his mouth although the metallic taste and saltiness clearly have improved.  Mycostatin did not improve his symptoms.  He underwent ENT exam that was apparently unremarkable as well.  His main complaint is early satiety.  He develops fullness in his chest and upper abdomen soon after beginning to eat.  He's lost almost 10 pounds.  He is on no gastric irritants.  He denies dysphagia.  He still has some regurgitation of gastric contents.     Review of Systems: Pertinent positive and negative review of systems were noted in the above HPI section. All other review of systems were otherwise negative.    Current Medications, Allergies, Past Medical History, Past Surgical History, Family History and Social History were reviewed in [facility] record  Vital signs were reviewed in today's medical record. Physical Exam: General: Well developed , well nourished, no acute distress   See Assessment and Plan under Problem List

## 2013-04-18 NOTE — Assessment & Plan Note (Signed)
Symptoms may be do to non-ulcer dyspepsia or ongoing acid reflux.  Although improved, he still has some ongoing issues.  Note oh satiety and weight loss.  Recommendations #1 continue dexilant #2 upper endoscopy

## 2013-04-18 NOTE — Patient Instructions (Signed)

## 2013-04-22 ENCOUNTER — Encounter: Payer: Self-pay | Admitting: Gastroenterology

## 2013-04-24 ENCOUNTER — Telehealth: Payer: Self-pay | Admitting: Gastroenterology

## 2013-04-24 NOTE — Telephone Encounter (Signed)
Patient asked should he take his Zegerid before eating explained to him to take 30 minutes before he eats   Answered all questions told pt to call back as needed

## 2013-05-01 ENCOUNTER — Telehealth: Payer: Self-pay | Admitting: Pulmonary Disease

## 2013-05-01 NOTE — Telephone Encounter (Signed)
Called and spoke with pt and he stated that he turned his machine in  a couple of weeks ago to [facility]---he stated that there was something that they could not download from his machine.  Pt is wanting to see if RA received this download.  RA please advise. Thanks  Allergies  Allergen Reactions  . Penicillins     As child

## 2013-05-02 ENCOUNTER — Ambulatory Visit (AMBULATORY_SURGERY_CENTER): Payer: BC Managed Care – PPO | Admitting: Gastroenterology

## 2013-05-02 ENCOUNTER — Encounter: Payer: Self-pay | Admitting: Gastroenterology

## 2013-05-02 VITALS — BP 141/94 | HR 67 | Temp 95.9°F | Resp 12 | Ht 71.0 in | Wt 199.0 lb

## 2013-05-02 DIAGNOSIS — D131 Benign neoplasm of stomach: Secondary | ICD-10-CM

## 2013-05-02 DIAGNOSIS — K2990 Gastroduodenitis, unspecified, without bleeding: Secondary | ICD-10-CM

## 2013-05-02 DIAGNOSIS — K2970 Gastritis, unspecified, without bleeding: Secondary | ICD-10-CM

## 2013-05-02 DIAGNOSIS — R1013 Epigastric pain: Principal | ICD-10-CM

## 2013-05-02 DIAGNOSIS — K3189 Other diseases of stomach and duodenum: Secondary | ICD-10-CM

## 2013-05-02 MED ORDER — SODIUM CHLORIDE 0.9 % IV SOLN: 500.0000 mL | INTRAVENOUS | Status: DC

## 2013-05-02 NOTE — Patient Instructions (Addendum)
YOU HAD AN ENDOSCOPIC PROCEDURE TODAY AT [facility]: Refer to the procedure report that was given to you for any specific questions about what was found during the examination.  If the procedure report does not answer your questions, please call your gastroenterologist to clarify.  If you requested that your care partner not be given the details of your procedure findings, then the procedure report has been included in a sealed envelope for you to review at your convenience later.  YOU SHOULD EXPECT: Some feelings of bloating in the abdomen. Passage of more gas than usual.  Walking can help get rid of the air that was put into your GI tract during the procedure and reduce the bloating. If you had a lower endoscopy (such as a colonoscopy or flexible sigmoidoscopy) you may notice spotting of blood in your stool or on the toilet paper. If you underwent a bowel prep for your procedure, then you may not have a normal bowel movement for a few days.  DIET: Your first meal following the procedure should be a light meal and then it is ok to progress to your normal diet.  A half-sandwich or bowl of soup is an example of a good first meal.  Heavy or fried foods are harder to digest and may make you feel nauseous or bloated.  Likewise meals heavy in dairy and vegetables can cause extra gas to form and this can also increase the bloating.  Drink plenty of fluids but you should avoid alcoholic beverages for 24 hours.  ACTIVITY: Your care partner should take you home directly after the procedure.  You should plan to take it easy, moving slowly for the rest of the day.  You can resume normal activity the day after the procedure however you should NOT DRIVE or use heavy machinery for 24 hours (because of the sedation medicines used during the test).    SYMPTOMS TO REPORT IMMEDIATELY: A gastroenterologist can be reached at any hour.  During normal business hours, 8:30 AM to 5:00 PM Monday through Friday,  call [phone].  After hours and on weekends, please call the GI answering service at [phone] who will take a message and have the physician on call contact you.   F  Following upper endoscopy (EGD)  Vomiting of blood or coffee ground material  New chest pain or pain under the shoulder blades  Painful or persistently difficult swallowing  New shortness of breath  Fever of 100F or higher  Black, tarry-looking stools  FOLLOW UP: If any biopsies were taken you will be contacted by phone or by letter within the next 1-3 weeks.  Call your gastroenterologist if you have not heard about the biopsies in 3 weeks.  Our staff will call the home number listed on your records the next business day following your procedure to check on you and address any questions or concerns that you may have at that time regarding the information given to you following your procedure. This is a courtesy call and so if there is no answer at the home number and we have not heard from you through the emergency physician on call, we will assume that you have returned to your regular daily activities without incident.  SIGNATURES/CONFIDENTIALITY: You and/or your care partner have signed paperwork which will be entered into your electronic medical record.  These signatures attest to the fact that that the information above on your After Visit Summary has been reviewed and is understood.  Full responsibility of the confidentiality of this discharge information lies with you and/or your care-partner.   Make appointment  To see Dr Deatra Ina back in office in 4-6 weeks   Continue PPI or anti reflux medication   Information on Gastritis given to you today

## 2013-05-02 NOTE — Progress Notes (Signed)
A/ox3 pleased with MAC, report to Penny RN 

## 2013-05-02 NOTE — Op Note (Signed)
[place]  [address]. [place], [id number]   ENDOSCOPY PROCEDURE REPORT  PATIENT: Ricardo Morris, Ricardo Morris  MR#: [id number] BIRTHDATE: March 04, 1951 , 62  yrs. old GENDER: Male ENDOSCOPIST: Inda Castle, MD REFERRED BY:  Levin Erp, M.D. PROCEDURE DATE:  05/02/2013 PROCEDURE:  EGD w/ biopsy ASA CLASS:     Class II INDICATIONS:  Dyspepsia. MEDICATIONS: MAC sedation, administered by CRNA, propofol (Diprivan) 150mg  IV, and Simethicone 0.6cc PO TOPICAL ANESTHETIC:  DESCRIPTION OF PROCEDURE: After the risks benefits and alternatives of the procedure were thoroughly explained, informed consent was obtained.  The LB [id number] endoscope was introduced through the mouth and advanced to the third portion of the duodenum. Without limitations.  The instrument was slowly withdrawn as the mucosa was fully examined.      There were multiple 1-2 mm fundic appearing polyps in the gastric fundus. There was very minimal mucosal edema in the gastric fundus and cardia.  Biopsies were taken.   The remainder of the upper endoscopy exam was otherwise normal.  Retroflexed views revealed no abnormalities.     The scope was then withdrawn from the patient and the procedure completed.  COMPLICATIONS: There were no complications. ENDOSCOPIC IMPRESSION: 1.   fundic appearing polyps 2.  nonspecific gastritis  RECOMMENDATIONS: 1.  Await pathology results 2.  Continue PPI 3.  office visit 4- 6 weeks  REPEAT EXAM:  eSigned:  Inda Castle, MD 05/02/2013 1:50 PM   CC:

## 2013-05-02 NOTE — Progress Notes (Signed)
Called to room to assist during endoscopic procedure.  Patient ID and intended procedure confirmed with present staff. Received instructions for my participation in the procedure from the performing physician.  

## 2013-05-03 ENCOUNTER — Telehealth: Payer: Self-pay

## 2013-05-03 NOTE — Telephone Encounter (Signed)
I do believe this is in [employer]. Will look when he returns back to the office in his paperwork.

## 2013-05-03 NOTE — Telephone Encounter (Signed)
Called [phone] pt had his ID on the recorder.  I left a message for the pt to call if any questions or concerns. maw

## 2013-05-06 NOTE — Telephone Encounter (Signed)
Pt advised. Jennifer Castillo, CMA  

## 2013-05-06 NOTE — Telephone Encounter (Signed)
lmomtcb x1 for pt 

## 2013-05-06 NOTE — Telephone Encounter (Signed)
cpap very effective when used avg pr 14 cm, avg usage 4h Keep on auto settings Increase usage every night

## 2013-05-06 NOTE — Telephone Encounter (Signed)
Pt is returning Mindy's call.  Ricardo Morris

## 2013-05-08 ENCOUNTER — Telehealth: Payer: Self-pay | Admitting: Pulmonary Disease

## 2013-05-08 ENCOUNTER — Encounter: Payer: Self-pay | Admitting: Gastroenterology

## 2013-05-08 NOTE — Telephone Encounter (Signed)
Spoke with pt. He had questions about buttons on his CPAP machine. i advised him to call his dme for further assistance. Nothing further needed

## 2013-05-17 ENCOUNTER — Ambulatory Visit: Payer: BC Managed Care – PPO | Admitting: Gastroenterology

## 2013-06-04 ENCOUNTER — Ambulatory Visit: Payer: BC Managed Care – PPO | Admitting: Gastroenterology

## 2013-07-01 ENCOUNTER — Encounter: Payer: Self-pay | Admitting: Gastroenterology

## 2013-07-01 ENCOUNTER — Ambulatory Visit (INDEPENDENT_AMBULATORY_CARE_PROVIDER_SITE_OTHER): Payer: BC Managed Care – PPO | Admitting: Gastroenterology

## 2013-07-01 VITALS — BP 120/80 | HR 64 | Ht 71.0 in | Wt 200.6 lb

## 2013-07-01 DIAGNOSIS — K3189 Other diseases of stomach and duodenum: Secondary | ICD-10-CM

## 2013-07-01 DIAGNOSIS — K5900 Constipation, unspecified: Secondary | ICD-10-CM | POA: Insufficient documentation

## 2013-07-01 DIAGNOSIS — R1013 Epigastric pain: Principal | ICD-10-CM

## 2013-07-01 HISTORY — DX: Constipation, unspecified: K59.00

## 2013-07-01 NOTE — Progress Notes (Signed)
          History of Present Illness:  The patient has returned for followup of dyspepsia.  On a regimen of Protonix daily he is feeling well.  She is without regurgitation or dysgeusia.  Endoscopy demonstrated fundic gland polyps and very mild nonspecific gastritis.  He complains of passing hard stools and has to strain.  He may have some rectal discomfort as well.  Colonoscopy in March, 2014 demonstrated diverticulosis.    Review of Systems: Pertinent positive and negative review of systems were noted in the above HPI section. All other review of systems were otherwise negative.    Current Medications, Allergies, Past Medical History, Past Surgical History, Family History and Social History were reviewed in [place] record  Vital signs were reviewed in today's medical record. Physical Exam: General: Well developed , well nourished, no acute distress   See Assessment and Plan under Problem List

## 2013-07-01 NOTE — Assessment & Plan Note (Signed)
Symptoms are well controlled with Protonix.  Plan to continue with the same.

## 2013-07-01 NOTE — Assessment & Plan Note (Signed)
Patient is having a very mild constipation with passage of hard stools accompanied by occasional rectal discomfort.  Recommendations #1 stool softener as needed.  Patient was also advised to increase fiber in his diet

## 2013-07-01 NOTE — Patient Instructions (Signed)
Follow up as needed

## 2013-12-18 ENCOUNTER — Emergency Department (HOSPITAL_COMMUNITY)
Admission: EM | Admit: 2013-12-18 | Discharge: 2013-12-18 | Disposition: A | Discharge status: Home / Self Care | Payer: BC Managed Care – PPO | Source: Home / Self Care

## 2013-12-18 NOTE — ED Notes (Signed)
Bed: UC06 Expected date:  Expected time:  Means of arrival:  Comments:

## 2013-12-25 ENCOUNTER — Encounter (HOSPITAL_COMMUNITY): Payer: Self-pay | Admitting: Emergency Medicine

## 2013-12-25 ENCOUNTER — Emergency Department (HOSPITAL_COMMUNITY)
Admission: EM | Admit: 2013-12-25 | Discharge: 2013-12-25 | Disposition: A | Discharge status: Home / Self Care | Payer: BC Managed Care – PPO | Attending: Emergency Medicine | Admitting: Emergency Medicine

## 2013-12-25 DIAGNOSIS — Z87442 Personal history of urinary calculi: Secondary | ICD-10-CM | POA: Diagnosis not present

## 2013-12-25 DIAGNOSIS — K219 Gastro-esophageal reflux disease without esophagitis: Secondary | ICD-10-CM | POA: Insufficient documentation

## 2013-12-25 DIAGNOSIS — Z8659 Personal history of other mental and behavioral disorders: Secondary | ICD-10-CM | POA: Insufficient documentation

## 2013-12-25 DIAGNOSIS — Z880 Allergy status to penicillin: Secondary | ICD-10-CM | POA: Diagnosis not present

## 2013-12-25 DIAGNOSIS — Z9981 Dependence on supplemental oxygen: Secondary | ICD-10-CM | POA: Diagnosis not present

## 2013-12-25 DIAGNOSIS — Z79899 Other long term (current) drug therapy: Secondary | ICD-10-CM | POA: Insufficient documentation

## 2013-12-25 DIAGNOSIS — Z860100 Personal history of colon polyps, unspecified: Secondary | ICD-10-CM | POA: Insufficient documentation

## 2013-12-25 DIAGNOSIS — Z77098 Contact with and (suspected) exposure to other hazardous, chiefly nonmedicinal, chemicals: Secondary | ICD-10-CM

## 2013-12-25 DIAGNOSIS — G478 Other sleep disorders: Secondary | ICD-10-CM | POA: Insufficient documentation

## 2013-12-25 DIAGNOSIS — M79609 Pain in unspecified limb: Secondary | ICD-10-CM | POA: Insufficient documentation

## 2013-12-25 NOTE — ED Provider Notes (Signed)
CSN: [id number]     Arrival date & time 12/25/13  0827 History   First MD Initiated Contact with Patient 12/25/13 [phone]     Chief Complaint  Patient presents with  . Hand Pain     (Consider location/radiation/quality/duration/timing/severity/associated sxs/prior Treatment) The history is provided by the patient and medical records.   This is a 63 y.o. M with PMH significant for anxiety, kidney stones, sleep apnea, presenting to the ED for chemical exposure of right hand.  Patient states 1 week ago he was spraying off his boat with hydroflouric acid and some of it dripped onto his right hand from the sprayer.  States he washed his hands but right ring and little finger was somewhat swollen afterwards.  Was seen by PCP and instructed to take OTC anti-histamines.  States skin has been feeling somewhat dry so he googled online and feels that he needs IV calcium gluconate, otherwise he may lose his hand.  No fever, chills, numbness, paresthesias, or weakness of right hand.  No skin flaking, blisters, skin erosion, or necrosis.  Past Medical History  Diagnosis Date  . GERD (gastroesophageal reflux disease)   . Sleep apnea     uses c-pap  . Anxiety   . Colon polyp   . Kidney stones     x1   Past Surgical History  Procedure Laterality Date  . Appendectomy  1956  . Nasal sinus surgery    . Colonoscopy    . Tonsillectomy     Family History  Problem Relation Age of Onset  . Asthma Father   . Emphysema Father   . Pancreatic cancer Neg Hx   . Throat cancer Neg Hx   . Stomach cancer Neg Hx   . Heart disease Neg Hx   . Diabetes Neg Hx   . Kidney disease Neg Hx   . Liver disease Neg Hx   . Colon cancer Neg Hx    History  Substance Use Topics  . Smoking status: Never Smoker   . Smokeless tobacco: Never Used  . Alcohol Use: No    Review of Systems  Skin:       Chemical exposure right hand  All other systems reviewed and are negative.     Allergies  Penicillins  Home  Medications   Prior to Admission medications   Medication Sig Start Date End Date Taking? Authorizing Provider  pantoprazole (PROTONIX) 40 MG tablet  06/12/13   Historical Provider, MD  Probiotic Product (PROBIOTIC DAILY PO) Take 1 tablet by mouth daily.    Historical Provider, MD  triazolam (HALCION) 0.25 MG tablet Take 0.25 mg by mouth at bedtime and may repeat dose one time if needed.    Historical Provider, MD   BP 171/90  Pulse 84  Temp(Src) 98.2 F (36.8 C) (Oral)  Resp 20  SpO2 100%  Physical Exam  Nursing note and vitals reviewed. Constitutional: He is oriented to person, place, and time. He appears well-developed and well-nourished. No distress.  HENT:  Head: Normocephalic and atraumatic.  Mouth/Throat: Oropharynx is clear and moist.  Eyes: Conjunctivae and EOM are normal. Pupils are equal, round, and reactive to light.  Neck: Normal range of motion. Neck supple.  Cardiovascular: Normal rate, regular rhythm and normal heart sounds.   Pulmonary/Chest: Effort normal and breath sounds normal. No respiratory distress. He has no wheezes.  Musculoskeletal: Normal range of motion.  Some discoloration noted of right 5th digit fingernail but remains intact; no evidence of chemical burns, skin  erosion, blisters, necrosis, or discoloration of right hand; all skin remains intact; no signs of cellulitis or superimposed infection; full ROM of wrist and all fingers; hand remains NVI  Neurological: He is alert and oriented to person, place, and time.  Skin: Skin is warm and dry. He is not diaphoretic.  Psychiatric: He has a normal mood and affect.    ED Course  Procedures (including critical care time) Labs Review Labs Reviewed - No data to display  Imaging Review No results found.   EKG Interpretation None      MDM   Final diagnoses:  Chemical exposure   1 week out from exposure to small amount of hydroflouric acid while cleaning boat.  This is patients second visit for the  same.  No evidence of chemical burn, skin erosion, blistering, or necrosis of right hand on exam today.  No fever or chills, non-toxic on exam.  No signs of superimposed infection.  Patient reassured and will FU with PCP.  Instructed he should use moisturizer if hands continue to feel dry. Discussed plan with patient, he/she acknowledged understanding and agreed with plan of care.  Return precautions given for new or worsening symptoms.  Larene Pickett, PA-C 12/25/13 1117

## 2013-12-25 NOTE — Discharge Instructions (Signed)
Keep hands moisturized. Follow-up with your primary care physician. Return to the ED for new concerns.

## 2013-12-25 NOTE — ED Provider Notes (Signed)
Medical screening examination/treatment/procedure(s) were performed by non-physician practitioner and as supervising physician I was immediately available for consultation/collaboration.   EKG Interpretation None        Francine Graven, DO 12/25/13 1817

## 2013-12-25 NOTE — ED Notes (Signed)
Spilled some acid on his rt hand  Mostly ring and little finger was swollen and he finally called the # on the cont was told he needed some calcium gel to put on it haas gotten better

## 2014-09-17 ENCOUNTER — Encounter: Payer: Self-pay | Admitting: Pulmonary Disease

## 2014-09-17 ENCOUNTER — Ambulatory Visit (INDEPENDENT_AMBULATORY_CARE_PROVIDER_SITE_OTHER): Payer: BLUE CROSS/BLUE SHIELD | Admitting: Pulmonary Disease

## 2014-09-17 VITALS — BP 124/80 | HR 75 | Ht 72.0 in | Wt 203.4 lb

## 2014-09-17 DIAGNOSIS — G4733 Obstructive sleep apnea (adult) (pediatric): Secondary | ICD-10-CM | POA: Diagnosis not present

## 2014-09-17 NOTE — Progress Notes (Signed)
   Subjective:    Patient ID: Ricardo Morris, male    DOB: 1950-05-28, 64 y.o.   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 He presented with loud snoring and daytime fatigue in spite of undergoing a deviated septum repair and UPPP by ENT.  PSG 2006 showed RDI of 13 events per hour corrected by CPAP of 7 cm with a full face mask and lowest desaturation of 89%. He obtained his CPAP- S8 escape-  from sleep med and has been compliant 19m download shows good compliance 05/2013 cpap very effective when used, avg pr 14 cm, avg usage 4h Keep on auto settings  09/17/2014  Chief Complaint  Patient presents with  . Sleep Apnea    Unable to obtain download.  Patient would like to discuss getting new CPAP machine that is wireless.  Patient does not feel he is getting enough sleep on CPAP.   We discussed fitbit data Wants letter for CPAP travel Wants to see if he will qualify for new cpap machine Wt unchanged   Review of Systems neg for any significant sore throat, dysphagia, itching, sneezing, nasal congestion or excess/ purulent secretions, fever, chills, sweats, unintended wt loss, pleuritic or exertional cp, hempoptysis, orthopnea pnd or change in chronic leg swelling. Also denies presyncope, palpitations, heartburn, abdominal pain, nausea, vomiting, diarrhea or change in bowel or urinary habits, dysuria,hematuria, rash, arthralgias, visual complaints, headache, numbness weakness or ataxia.     Objective:   Physical Exam  Gen. Pleasant,  in no distress ENT - no lesions, no post nasal drip Neck: No JVD, no thyromegaly, no carotid bruits Lungs: no use of accessory muscles, no dullness to percussion, decreased without rales or rhonchi  Cardiovascular: Rhythm regular, heart sounds  normal, no murmurs or gallops, no peripheral edema Musculoskeletal: No deformities, no cyanosis or clubbing , no tremors       Assessment & Plan:

## 2014-09-17 NOTE — Patient Instructions (Signed)
Home sleep study Letter for CPAP We will ask DME to check if you qualify for new CPAP

## 2014-09-17 NOTE — Assessment & Plan Note (Addendum)
Home sleep study - to seif OSA has improved, was mild to begin with , discussed limited risk of cardiac complications Letter for CPAP We will ask DME to check if you qualify for new CPAP  Weight loss encouraged, compliance with goal of at least 4-6 hrs every night is the expectation. Advised against medications with sedative side effects Cautioned against driving when sleepy - understanding that sleepiness will vary on a day to day basis

## 2014-11-11 ENCOUNTER — Encounter: Payer: Self-pay | Admitting: Gastroenterology

## 2016-11-07 DIAGNOSIS — G4733 Obstructive sleep apnea (adult) (pediatric): Secondary | ICD-10-CM | POA: Diagnosis not present

## 2016-11-07 DIAGNOSIS — I422 Other hypertrophic cardiomyopathy: Secondary | ICD-10-CM | POA: Diagnosis not present

## 2016-11-07 DIAGNOSIS — E663 Overweight: Secondary | ICD-10-CM | POA: Diagnosis not present

## 2016-11-07 DIAGNOSIS — R9431 Abnormal electrocardiogram [ECG] [EKG]: Secondary | ICD-10-CM | POA: Diagnosis not present

## 2016-12-15 DIAGNOSIS — G4733 Obstructive sleep apnea (adult) (pediatric): Secondary | ICD-10-CM | POA: Diagnosis not present

## 2016-12-16 ENCOUNTER — Ambulatory Visit: Payer: BLUE CROSS/BLUE SHIELD | Admitting: Adult Health

## 2016-12-20 ENCOUNTER — Telehealth: Payer: Self-pay | Admitting: Adult Health

## 2016-12-20 NOTE — Telephone Encounter (Signed)
Sppke with pt, he states he had a sleep study and has a very old CPAP machine and I advised him at this appt we will go over everything and possibly order another CPAP machine. I advised him that he may need another sleep study. Pt understood and nothing else is needed. FYI Jess

## 2016-12-22 ENCOUNTER — Encounter: Payer: Self-pay | Admitting: Adult Health

## 2016-12-22 ENCOUNTER — Ambulatory Visit (INDEPENDENT_AMBULATORY_CARE_PROVIDER_SITE_OTHER): Payer: PPO | Admitting: Adult Health

## 2016-12-22 VITALS — BP 124/72 | HR 84 | Ht 72.0 in | Wt 199.6 lb

## 2016-12-22 DIAGNOSIS — G4733 Obstructive sleep apnea (adult) (pediatric): Secondary | ICD-10-CM | POA: Diagnosis not present

## 2016-12-22 NOTE — Progress Notes (Signed)
@Patient  ID: Ricardo Morris, male    DOB: 1950/04/26, 66 y.o.   MRN: [id number]  Chief Complaint  Patient presents with  . Follow-up    OSA    Referring provider: Levin Erp, MD  HPI: 66 yo male followed for OSA  S/p UPPP   TEST  PSG 2006 showed RDI of 13 events per hour corrected by CPAP of 7 cm with a full face mask and lowest desaturation of 89%  12/22/2016 Followed OSA  Patient returns for follow-up for sleep apnea. Patient was last seen June 2016. Patient says that he is on C Pap at bedtime. Wears it each night. Feels that his machine is getting very old and would like order for new machine. Patient she is unable to have a download. Patient denies any significant daytime sleepiness when he wears it. Wears each night for at least 6 hours.  Has not been able to wear for last few weeks as machine is not working well.  We discussed ordering new CPAP machine at bedtime .    Allergies  Allergen Reactions  . Penicillins     As child    Immunization History  Administered Date(s) Administered  . Influenza Split 01/02/2013  . Influenza Whole 01/02/2009  . Influenza-Unspecified 01/15/2014  . Zoster 04/05/2011    Past Medical History:  Diagnosis Date  . Anxiety   . Colon polyp   . GERD (gastroesophageal reflux disease)   . Kidney stones    x1  . Sleep apnea    uses c-pap    Tobacco History: History  Smoking Status  . Never Smoker  Smokeless Tobacco  . Never Used   Counseling given: Not Answered   Outpatient Encounter Prescriptions as of 12/22/2016  Medication Sig  . amitriptyline (ELAVIL) 10 MG tablet Take 10 mg by mouth at bedtime. Takes very rarely  . losartan (COZAAR) 50 MG tablet Take 50 mg by mouth daily.  . pantoprazole (PROTONIX) 40 MG tablet   . triazolam (HALCION) 0.25 MG tablet Take 0.25 mg by mouth at bedtime and may repeat dose one time if needed.   No facility-administered encounter medications on file as of 12/22/2016.      Review of  Systems  Constitutional:   No  weight loss, night sweats,  Fevers, chills, fatigue, or  lassitude.  HEENT:   No headaches,  Difficulty swallowing,  Tooth/dental problems, or  Sore throat,                No sneezing, itching, ear ache, nasal congestion, post nasal drip,   CV:  No chest pain,  Orthopnea, PND, swelling in lower extremities, anasarca, dizziness, palpitations, syncope.   GI  No heartburn, indigestion, abdominal pain, nausea, vomiting, diarrhea, change in bowel habits, loss of appetite, bloody stools.   Resp: No shortness of breath with exertion or at rest.  No excess mucus, no productive cough,  No non-productive cough,  No coughing up of blood.  No change in color of mucus.  No wheezing.  No chest wall deformity  Skin: no rash or lesions.  GU: no dysuria, change in color of urine, no urgency or frequency.  No flank pain, no hematuria   MS:  No joint pain or swelling.  No decreased range of motion.  No back pain.    Physical Exam  BP 124/72 (BP Location: Left Arm, Cuff Size: Normal)   Pulse 84   Ht 6' (1.829 m)   Wt 199 lb 9.6 oz (90.5  kg)   SpO2 96%   BMI 27.07 kg/m   GEN: A/Ox3; pleasant , NAD, well nourished    HEENT:  [place]/AT,  EACs-clear, TMs-wnl, NOSE-clear, THROAT-clear, no lesions, no postnasal drip or exudate noted.  Class 2 MP airway   NECK:  Supple w/ fair ROM; no JVD; normal carotid impulses w/o bruits; no thyromegaly or nodules palpated; no lymphadenopathy.    RESP  Clear  P & A; w/o, wheezes/ rales/ or rhonchi. no accessory muscle use, no dullness to percussion  CARD:  RRR, no m/r/g, no peripheral edema, pulses intact, no cyanosis or clubbing.  GI:   Soft & nt; nml bowel sounds; no organomegaly or masses detected.   Musco: Warm bil, no deformities or joint swelling noted.   Neuro: alert, no focal deficits noted.    Skin: Warm, no lesions or rashes    Lab Results:  CBC No results found for: WBC, RBC, HGB, HCT, PLT, MCV, MCH, MCHC, RDW,  LYMPHSABS, MONOABS, EOSABS, BASOSABS  BMET No results found for: NA, K, CL, CO2, GLUCOSE, BUN, CREATININE, CALCIUM, GFRNONAA, GFRAA  BNP No results found for: BNP  ProBNP No results found for: PROBNP  Imaging: No results found.   Assessment & Plan:   OSA (obstructive sleep apnea) OSA - needs new CPAP machine  Pt education   Plan  Patient Instructions  Continue on C Pap at bedtime Wear each night for at least 4-6 hours Do not drive if sleepy Order for new C Pap machine to DME. Follow-up in 3 months  with. Dr. Elsworth Soho  And As needed          Rexene Edison, NP 12/22/2016

## 2016-12-22 NOTE — Assessment & Plan Note (Signed)
OSA - needs new CPAP machine  Pt education   Plan  Patient Instructions  Continue on C Pap at bedtime Wear each night for at least 4-6 hours Do not drive if sleepy Order for new C Pap machine to DME. Follow-up in 3 months  with. Dr. Elsworth Soho  And As needed

## 2016-12-22 NOTE — Patient Instructions (Addendum)
Continue on C Pap at bedtime Wear each night for at least 4-6 hours Do not drive if sleepy Order for new C Pap machine to DME. Follow-up in 3 months  with. Dr. Elsworth Soho  And As needed

## 2016-12-26 ENCOUNTER — Telehealth: Payer: Self-pay | Admitting: Pulmonary Disease

## 2016-12-26 DIAGNOSIS — G4733 Obstructive sleep apnea (adult) (pediatric): Secondary | ICD-10-CM

## 2016-12-26 NOTE — Telephone Encounter (Signed)
I have looked in pt chart to see if I could find the previous setting and had no luck. TP he saw you last, please advise.

## 2016-12-26 NOTE — Telephone Encounter (Signed)
Spoke with patient. He stated that he was not sure of the current pressure settings. I asked if he ever his machine adjusted and he stated no.   ATC Melissa, no answer. Will call back. It appears that the pressure setting will be at 7cm.

## 2016-12-26 NOTE — Telephone Encounter (Signed)
Last note I see is 7cm H2o . But please call pt and ask if he is aware of pressure setting .

## 2016-12-28 NOTE — Telephone Encounter (Signed)
Melissa with [facility] is returning phone call

## 2016-12-28 NOTE — Telephone Encounter (Signed)
lmomtcb x2 for Melissa with [facility].  

## 2016-12-28 NOTE — Telephone Encounter (Signed)
Spoke with Melissa. She stated that we could send in an order for 7cm or order for auto CPAP and [facility] would take care of the rest.  New order needs to be placed with CPAP template.   TP, please advise if you want to use the 7cm setting or change to auto cpap. Thanks!

## 2016-12-29 NOTE — Telephone Encounter (Signed)
Order placed. Nothing further needed. 

## 2016-12-29 NOTE — Telephone Encounter (Signed)
7cmH20 is fine . Download in 6 weeks after gets new machine .

## 2017-01-18 DIAGNOSIS — Z125 Encounter for screening for malignant neoplasm of prostate: Secondary | ICD-10-CM | POA: Diagnosis not present

## 2017-01-18 DIAGNOSIS — Z23 Encounter for immunization: Secondary | ICD-10-CM | POA: Diagnosis not present

## 2017-01-18 DIAGNOSIS — I10 Essential (primary) hypertension: Secondary | ICD-10-CM | POA: Diagnosis not present

## 2017-01-18 DIAGNOSIS — G4730 Sleep apnea, unspecified: Secondary | ICD-10-CM | POA: Diagnosis not present

## 2017-01-18 DIAGNOSIS — Z6841 Body Mass Index (BMI) 40.0 and over, adult: Secondary | ICD-10-CM | POA: Diagnosis not present

## 2017-01-18 DIAGNOSIS — Z1211 Encounter for screening for malignant neoplasm of colon: Secondary | ICD-10-CM | POA: Diagnosis not present

## 2017-01-25 ENCOUNTER — Telehealth: Payer: Self-pay | Admitting: Pulmonary Disease

## 2017-01-25 DIAGNOSIS — G4733 Obstructive sleep apnea (adult) (pediatric): Secondary | ICD-10-CM | POA: Diagnosis not present

## 2017-01-25 NOTE — Telephone Encounter (Signed)
Spoke with pt, answered cpap questions to best of my ability.  Scheduled rov as pt was recently put on a new cpap machine and needs an ov for documented compliance between 31-90 days of cpap setup.  Ov scheduled.  Nothing further needed.

## 2017-02-24 ENCOUNTER — Encounter: Payer: Self-pay | Admitting: Pulmonary Disease

## 2017-02-25 DIAGNOSIS — G4733 Obstructive sleep apnea (adult) (pediatric): Secondary | ICD-10-CM | POA: Diagnosis not present

## 2017-03-01 ENCOUNTER — Ambulatory Visit: Payer: PPO | Admitting: Pulmonary Disease

## 2017-03-01 ENCOUNTER — Encounter: Payer: Self-pay | Admitting: Pulmonary Disease

## 2017-03-01 DIAGNOSIS — F5104 Psychophysiologic insomnia: Secondary | ICD-10-CM | POA: Diagnosis not present

## 2017-03-01 DIAGNOSIS — G4733 Obstructive sleep apnea (adult) (pediatric): Secondary | ICD-10-CM

## 2017-03-01 NOTE — Assessment & Plan Note (Addendum)
CPAP 7 cm seems to work well Long discussion about benefits of CPAP.  I explained the cardiovascular implications of mild OSA and minimal and he would consider coming off CPAP we will have a repeat home sleep study to see whether he is still in the minimal range.  He feels that he has benefited from CPAP therapy in terms of quality of life in general and would like to continue using this

## 2017-03-01 NOTE — Assessment & Plan Note (Signed)
Rules of sleep hygiene discussed

## 2017-03-01 NOTE — Progress Notes (Signed)
   Subjective:    Patient ID: Ricardo Morris, male    DOB: [birth year], 66 y.o.   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  He obtained a new machine 12/2016 and really likes it because this is quieter and he feels he has not had as much problems with dryness anymore. He wonders if the machine really helps him or not. No problems with mask or pressure. CPAP download was reviewed which shows good control of events and 7 cm and minimal leak usage is about 5.5 hours on average  He denies increase in sleep pressure and feels rested in the daytime   Significant tests/ events reviewed  PSG 2006 >>RDI of 13 events per hour corrected by CPAP of 7 cm with a full face mask and lowest desaturation of 89%   Review of Systems  Patient denies significant dyspnea,cough, hemoptysis,  chest pain, palpitations, pedal edema, orthopnea, paroxysmal nocturnal dyspnea, lightheadedness, nausea, vomiting, abdominal or  leg pains      Objective:   Physical Exam   Gen. Pleasant, well-nourished, in no distress ENT - no thrush, no post nasal drip Neck: No JVD, no thyromegaly, no carotid bruits Lungs: no use of accessory muscles, no dullness to percussion, clear without rales or rhonchi  Cardiovascular: Rhythm regular, heart sounds  normal, no murmurs or gallops, no peripheral edema Musculoskeletal: No deformities, no cyanosis or clubbing         Assessment & Plan:

## 2017-03-01 NOTE — Patient Instructions (Signed)
Your CPAP is set at 7 cm & is working well

## 2017-03-27 DIAGNOSIS — G4733 Obstructive sleep apnea (adult) (pediatric): Secondary | ICD-10-CM | POA: Diagnosis not present

## 2017-04-10 DIAGNOSIS — H5203 Hypermetropia, bilateral: Secondary | ICD-10-CM | POA: Diagnosis not present

## 2017-04-10 DIAGNOSIS — H52223 Regular astigmatism, bilateral: Secondary | ICD-10-CM | POA: Diagnosis not present

## 2017-04-10 DIAGNOSIS — H524 Presbyopia: Secondary | ICD-10-CM | POA: Diagnosis not present

## 2017-04-27 DIAGNOSIS — G4733 Obstructive sleep apnea (adult) (pediatric): Secondary | ICD-10-CM | POA: Diagnosis not present

## 2017-05-28 DIAGNOSIS — G4733 Obstructive sleep apnea (adult) (pediatric): Secondary | ICD-10-CM | POA: Diagnosis not present

## 2017-06-08 DIAGNOSIS — G4733 Obstructive sleep apnea (adult) (pediatric): Secondary | ICD-10-CM | POA: Diagnosis not present

## 2017-06-25 DIAGNOSIS — G4733 Obstructive sleep apnea (adult) (pediatric): Secondary | ICD-10-CM | POA: Diagnosis not present

## 2017-07-05 DIAGNOSIS — B353 Tinea pedis: Secondary | ICD-10-CM | POA: Diagnosis not present

## 2017-07-05 DIAGNOSIS — B352 Tinea manuum: Secondary | ICD-10-CM | POA: Diagnosis not present

## 2017-07-05 DIAGNOSIS — L821 Other seborrheic keratosis: Secondary | ICD-10-CM | POA: Diagnosis not present

## 2017-07-05 DIAGNOSIS — D225 Melanocytic nevi of trunk: Secondary | ICD-10-CM | POA: Diagnosis not present

## 2017-07-05 DIAGNOSIS — L720 Epidermal cyst: Secondary | ICD-10-CM | POA: Diagnosis not present

## 2017-07-05 DIAGNOSIS — D1801 Hemangioma of skin and subcutaneous tissue: Secondary | ICD-10-CM | POA: Diagnosis not present

## 2017-07-05 DIAGNOSIS — D224 Melanocytic nevi of scalp and neck: Secondary | ICD-10-CM | POA: Diagnosis not present

## 2017-07-05 DIAGNOSIS — L738 Other specified follicular disorders: Secondary | ICD-10-CM | POA: Diagnosis not present

## 2017-07-24 DIAGNOSIS — B351 Tinea unguium: Secondary | ICD-10-CM | POA: Diagnosis not present

## 2017-07-24 DIAGNOSIS — I10 Essential (primary) hypertension: Secondary | ICD-10-CM | POA: Diagnosis not present

## 2017-07-26 DIAGNOSIS — G4733 Obstructive sleep apnea (adult) (pediatric): Secondary | ICD-10-CM | POA: Diagnosis not present

## 2017-08-04 DIAGNOSIS — H40011 Open angle with borderline findings, low risk, right eye: Secondary | ICD-10-CM | POA: Diagnosis not present

## 2017-08-21 DIAGNOSIS — H40011 Open angle with borderline findings, low risk, right eye: Secondary | ICD-10-CM | POA: Diagnosis not present

## 2017-08-25 DIAGNOSIS — G4733 Obstructive sleep apnea (adult) (pediatric): Secondary | ICD-10-CM | POA: Diagnosis not present

## 2017-09-12 DIAGNOSIS — G4733 Obstructive sleep apnea (adult) (pediatric): Secondary | ICD-10-CM | POA: Diagnosis not present

## 2017-09-12 DIAGNOSIS — Z5181 Encounter for therapeutic drug level monitoring: Secondary | ICD-10-CM | POA: Diagnosis not present

## 2017-09-12 DIAGNOSIS — I10 Essential (primary) hypertension: Secondary | ICD-10-CM | POA: Diagnosis not present

## 2017-09-13 DIAGNOSIS — H40011 Open angle with borderline findings, low risk, right eye: Secondary | ICD-10-CM | POA: Diagnosis not present

## 2017-09-25 DIAGNOSIS — G4733 Obstructive sleep apnea (adult) (pediatric): Secondary | ICD-10-CM | POA: Diagnosis not present

## 2017-09-26 DIAGNOSIS — J3501 Chronic tonsillitis: Secondary | ICD-10-CM | POA: Diagnosis not present

## 2017-10-19 ENCOUNTER — Other Ambulatory Visit: Payer: Self-pay | Admitting: Internal Medicine

## 2017-10-19 ENCOUNTER — Ambulatory Visit
Admission: RE | Admit: 2017-10-19 | Discharge: 2017-10-19 | Disposition: A | Discharge status: Home / Self Care | Payer: PPO | Source: Ambulatory Visit | Attending: Internal Medicine | Admitting: Internal Medicine

## 2017-10-19 DIAGNOSIS — S0993XA Unspecified injury of face, initial encounter: Secondary | ICD-10-CM

## 2017-10-19 DIAGNOSIS — G501 Atypical facial pain: Secondary | ICD-10-CM | POA: Diagnosis not present

## 2017-10-19 DIAGNOSIS — S0992XA Unspecified injury of nose, initial encounter: Secondary | ICD-10-CM | POA: Diagnosis not present

## 2017-10-25 DIAGNOSIS — G4733 Obstructive sleep apnea (adult) (pediatric): Secondary | ICD-10-CM | POA: Diagnosis not present

## 2017-10-30 DIAGNOSIS — H401211 Low-tension glaucoma, right eye, mild stage: Secondary | ICD-10-CM | POA: Diagnosis not present

## 2017-11-20 DIAGNOSIS — I712 Thoracic aortic aneurysm, without rupture: Secondary | ICD-10-CM | POA: Diagnosis not present

## 2017-11-20 DIAGNOSIS — E663 Overweight: Secondary | ICD-10-CM | POA: Diagnosis not present

## 2017-11-20 DIAGNOSIS — R9431 Abnormal electrocardiogram [ECG] [EKG]: Secondary | ICD-10-CM | POA: Diagnosis not present

## 2017-11-20 DIAGNOSIS — I429 Cardiomyopathy, unspecified: Secondary | ICD-10-CM | POA: Diagnosis not present

## 2017-11-20 DIAGNOSIS — I422 Other hypertrophic cardiomyopathy: Secondary | ICD-10-CM | POA: Diagnosis not present

## 2017-11-20 DIAGNOSIS — G4733 Obstructive sleep apnea (adult) (pediatric): Secondary | ICD-10-CM | POA: Diagnosis not present

## 2017-11-21 ENCOUNTER — Other Ambulatory Visit: Payer: Self-pay | Admitting: Cardiology

## 2017-11-21 DIAGNOSIS — I712 Thoracic aortic aneurysm, without rupture, unspecified: Secondary | ICD-10-CM

## 2017-11-23 ENCOUNTER — Ambulatory Visit
Admission: RE | Admit: 2017-11-23 | Discharge: 2017-11-23 | Disposition: A | Discharge status: Home / Self Care | Payer: PPO | Source: Ambulatory Visit | Attending: Cardiology | Admitting: Cardiology

## 2017-11-23 DIAGNOSIS — I712 Thoracic aortic aneurysm, without rupture, unspecified: Secondary | ICD-10-CM

## 2017-11-25 DIAGNOSIS — G4733 Obstructive sleep apnea (adult) (pediatric): Secondary | ICD-10-CM | POA: Diagnosis not present

## 2017-11-27 DIAGNOSIS — H20011 Primary iridocyclitis, right eye: Secondary | ICD-10-CM | POA: Diagnosis not present

## 2017-11-27 DIAGNOSIS — H21541 Posterior synechiae (iris), right eye: Secondary | ICD-10-CM | POA: Diagnosis not present

## 2017-11-27 DIAGNOSIS — H211X1 Other vascular disorders of iris and ciliary body, right eye: Secondary | ICD-10-CM | POA: Diagnosis not present

## 2017-11-28 DIAGNOSIS — H20022 Recurrent acute iridocyclitis, left eye: Secondary | ICD-10-CM | POA: Diagnosis not present

## 2017-12-05 DIAGNOSIS — H20011 Primary iridocyclitis, right eye: Secondary | ICD-10-CM | POA: Diagnosis not present

## 2017-12-11 DIAGNOSIS — G4730 Sleep apnea, unspecified: Secondary | ICD-10-CM | POA: Diagnosis not present

## 2017-12-11 DIAGNOSIS — I10 Essential (primary) hypertension: Secondary | ICD-10-CM | POA: Diagnosis not present

## 2017-12-12 DIAGNOSIS — H20011 Primary iridocyclitis, right eye: Secondary | ICD-10-CM | POA: Diagnosis not present

## 2017-12-26 DIAGNOSIS — G4733 Obstructive sleep apnea (adult) (pediatric): Secondary | ICD-10-CM | POA: Diagnosis not present

## 2018-01-09 DIAGNOSIS — H20011 Primary iridocyclitis, right eye: Secondary | ICD-10-CM | POA: Diagnosis not present

## 2018-01-19 ENCOUNTER — Other Ambulatory Visit: Payer: Self-pay

## 2018-01-19 ENCOUNTER — Emergency Department (HOSPITAL_COMMUNITY)
Admission: EM | Admit: 2018-01-19 | Discharge: 2018-01-19 | Disposition: A | Discharge status: Home / Self Care | Payer: PPO | Attending: Emergency Medicine | Admitting: Emergency Medicine

## 2018-01-19 ENCOUNTER — Encounter (HOSPITAL_COMMUNITY): Payer: Self-pay | Admitting: *Deleted

## 2018-01-19 DIAGNOSIS — R109 Unspecified abdominal pain: Secondary | ICD-10-CM | POA: Diagnosis present

## 2018-01-19 DIAGNOSIS — N23 Unspecified renal colic: Secondary | ICD-10-CM | POA: Insufficient documentation

## 2018-01-19 DIAGNOSIS — Z79899 Other long term (current) drug therapy: Secondary | ICD-10-CM | POA: Insufficient documentation

## 2018-01-19 LAB — URINALYSIS, ROUTINE W REFLEX MICROSCOPIC
Bilirubin Urine: NEGATIVE
Glucose, UA: NEGATIVE mg/dL
Ketones, ur: NEGATIVE mg/dL
Leukocytes, UA: NEGATIVE
Nitrite: NEGATIVE
PH: 5 (ref 5.0–8.0)
Protein, ur: 30 mg/dL — AB
Specific Gravity, Urine: 1.021 (ref 1.005–1.030)

## 2018-01-19 MED ORDER — KETOROLAC TROMETHAMINE 15 MG/ML IJ SOLN
7.5000 mg | Freq: Once | INTRAMUSCULAR | Status: DC
  Filled 2018-01-19: qty 1

## 2018-01-19 MED ORDER — KETOROLAC TROMETHAMINE 15 MG/ML IJ SOLN
15.0000 mg | Freq: Once | INTRAMUSCULAR | Status: AC
  Administered 2018-01-19: 15 mg via INTRAVENOUS
  Filled 2018-01-19: qty 1

## 2018-01-19 MED ORDER — IBUPROFEN 600 MG PO TABS: 600.0000 mg | ORAL_TABLET | Freq: Four times a day (QID) | ORAL | 0 refills | Status: DC | PRN

## 2018-01-19 MED ORDER — HYDROCODONE-ACETAMINOPHEN 5-325 MG PO TABS: 1.0000 | ORAL_TABLET | Freq: Three times a day (TID) | ORAL | 0 refills | Status: AC | PRN

## 2018-01-19 MED ORDER — TAMSULOSIN HCL 0.4 MG PO CAPS: 0.4000 mg | ORAL_CAPSULE | Freq: Every day | ORAL | 0 refills | Status: AC

## 2018-01-19 NOTE — ED Provider Notes (Signed)
[facility] EMERGENCY DEPARTMENT Provider Note  CSN: [id number] Arrival date & time: 01/19/18 0445  Chief Complaint(s) Abdominal Pain  HPI Ricardo Morris is a 67 y.o. male   The history is provided by the patient.  Flank Pain  This is a new (Cramping) problem. The current episode started 1 to 2 hours ago. The problem occurs constantly. The problem has not changed (fluctuating) since onset.Pertinent negatives include no chest pain, no headaches and no shortness of breath. Nothing aggravates the symptoms. Nothing relieves the symptoms. He has tried nothing for the symptoms.    Past Medical History Past Medical History:  Diagnosis Date  . Anxiety   . Colon polyp   . GERD (gastroesophageal reflux disease)   . Kidney stones    x1  . Sleep apnea    uses c-pap   Patient Active Problem List   Diagnosis Date Noted  . Unspecified constipation 07/01/2013  . Dyspepsia and other specified disorders of function of stomach 04/18/2013  . Diverticulosis of colon without hemorrhage 02/21/2013  . ALLERGIC RHINITIS 05/31/2009  . OSA (obstructive sleep apnea) 05/26/2009  . RHINITIS 05/26/2009  . INSOMNIA 05/26/2009   Home Medication(s) Prior to Admission medications   Medication Sig Start Date End Date Taking? Authorizing Provider  amitriptyline (ELAVIL) 10 MG tablet Take 10 mg by mouth at bedtime.    Yes [provider]  cyclopentolate (CYCLODRYL,CYCLOGYL) 1 % ophthalmic solution Place 1 drop into the right eye 3 (three) times daily. 01/11/18  Yes [provider]  losartan (COZAAR) 50 MG tablet Take 50 mg by mouth daily.   Yes [provider]  pantoprazole (PROTONIX) 40 MG tablet Take 40 mg by mouth daily.  06/12/13  Yes [provider]  triazolam (HALCION) 0.25 MG tablet Take 0.25 mg by mouth at bedtime and may repeat dose one time if needed.   Yes [provider]  HYDROcodone-acetaminophen (NORCO/VICODIN) 5-325 MG tablet Take 1  tablet by mouth every 8 (eight) hours as needed for up to 5 days for severe pain (That is not improved by your scheduled motrin regimen). Please do not exceed 4000 mg of acetaminophen (Tylenol) a 24-hour period. Please note that he may be prescribed additional medicine that contains acetaminophen. 01/19/18 01/24/18  Fatima Blank, MD  ibuprofen (ADVIL,MOTRIN) 600 MG tablet Take 1 tablet (600 mg total) by mouth every 6 (six) hours as needed. 01/19/18   Fatima Blank, MD  tamsulosin (FLOMAX) 0.4 MG CAPS capsule Take 1 capsule (0.4 mg total) by mouth daily for 10 days. 01/19/18 01/29/18  Fatima Blank, MD                                                                                                                                    Past Surgical History Past Surgical History:  Procedure Laterality Date  . APPENDECTOMY  1956  . COLONOSCOPY    . NASAL  SINUS SURGERY    . tonsillectomy     Family History Family History  Problem Relation Age of Onset  . Asthma Father   . Emphysema Father   . Pancreatic cancer Neg Hx   . Throat cancer Neg Hx   . Stomach cancer Neg Hx   . Heart disease Neg Hx   . Diabetes Neg Hx   . Kidney disease Neg Hx   . Liver disease Neg Hx   . Colon cancer Neg Hx     Social History Social History   Tobacco Use  . Smoking status: Never Smoker  . Smokeless tobacco: Never Used  Substance Use Topics  . Alcohol use: No  . Drug use: No   Allergies Penicillins  Review of Systems Review of Systems  Respiratory: Negative for shortness of breath.   Cardiovascular: Negative for chest pain.  Gastrointestinal: Negative for diarrhea, nausea and vomiting.  Genitourinary: Positive for flank pain and hematuria.  Neurological: Negative for headaches.   All other systems are reviewed and are negative for acute change except as noted in the HPI  Physical Exam Vital Signs  I have reviewed the triage vital signs BP (!) 154/94   Pulse 71    Temp 97.6 F (36.4 C) (Oral)   Resp 16   Ht 6' (1.829 m)   Wt 90.7 kg   SpO2 100%   BMI 27.12 kg/m   Physical Exam  Constitutional: He is oriented to person, place, and time. He appears well-developed and well-nourished. No distress.  HENT:  Head: Normocephalic and atraumatic.  Right Ear: External ear normal.  Left Ear: External ear normal.  Nose: Nose normal.  Mouth/Throat: Mucous membranes are normal. No trismus in the jaw.  Eyes: Conjunctivae and EOM are normal. No scleral icterus.  Neck: Normal range of motion and phonation normal.  Cardiovascular: Normal rate and regular rhythm.  Pulmonary/Chest: Effort normal. No stridor. No respiratory distress.  Abdominal: He exhibits no distension. There is no tenderness. There is no rigidity, no rebound and no guarding.  Musculoskeletal: Normal range of motion. He exhibits no edema.  Neurological: He is alert and oriented to person, place, and time.  Skin: He is not diaphoretic.  Psychiatric: He has a normal mood and affect. His behavior is normal.  Vitals reviewed.   ED Results and Treatments Labs (all labs ordered are listed, but only abnormal results are displayed) Labs Reviewed  URINALYSIS, ROUTINE W REFLEX MICROSCOPIC - Abnormal; Notable for the following components:      Result Value   APPearance HAZY (*)    Hgb urine dipstick LARGE (*)    Protein, ur 30 (*)    Bacteria, UA RARE (*)    All other components within normal limits                                                                                                                         EKG  EKG Interpretation  Date/Time:    Ventricular  Rate:    PR Interval:    QRS Duration:   QT Interval:    QTC Calculation:   R Axis:     Text Interpretation:        Radiology No results found. Pertinent labs & imaging results that were available during my care of the patient were reviewed by me and considered in my medical decision making (see chart for  details).  Medications Ordered in ED Medications  ketorolac (TORADOL) 15 MG/ML injection 15 mg (15 mg Intravenous Given 01/19/18 0549)                                                                                                                                    Procedures Procedures EMERGENCY DEPARTMENT US RENAL EXAM  "Study: Limited Retroperitoneal Ultrasound of Kidneys"  INDICATIONS: Flank pain Long and short axis of both kidneys were obtained.   PERFORMED BY: Myself IMAGES ARCHIVED?: Yes LIMITATIONS: Body habitus VIEWS USED: Long axis and Short axis  INTERPRETATION: Left  Hydronephrosis mild, No Kidney stone  EMERGENCY DEPARTMENT ULTRASOUND  Study: Limited Retroperitoneal Ultrasound of the Abdominal Aorta.  INDICATIONS:Abdominal pain and Age>55 Multiple views of the abdominal aorta were obtained in real-time from the diaphragmatic hiatus to the aortic bifurcation in transverse planes with a multi-frequency probe.  PERFORMED BY: Myself IMAGES ARCHIVED?: Yes LIMITATIONS:  Body habitus and Bowel gas INTERPRETATION:  No abdominal aortic aneurysm    (including critical care time)  Medical Decision Making / ED Course I have reviewed the nursing notes for this encounter and the patient's prior records (if available in EHR or on provided paperwork).    Left flank pain.  Sudden onset.  Patient with a history of thoracic aortic aneurysm.  Presentation is most suspicious for renal colic however given aortic history, bedside ultrasound was performed without evidence of AAA or aortic flap concerning for dissection.  Ultrasound did reveal very mild hydronephrosis on the left.  UA notable for hematuria without evidence of infection.  Treated with Toradol resulting in significant pain improvement.  The patient appears reasonably screened and/or stabilized for discharge and I doubt any other medical condition or other [facility] requiring further screening, evaluation, or treatment in the ED  at this time prior to discharge.  The patient is safe for discharge with strict return precautions.   Final Clinical Impression(s) / ED Diagnoses Final diagnoses:  Renal colic on left side    Disposition: Discharge  Condition: Good  I have discussed the results, Dx and Tx plan with the patient who expressed understanding and agree(s) with the plan. Discharge instructions discussed at great length. The patient was given strict return precautions who verbalized understanding of the instructions. No further questions at time of discharge.    ED Discharge Orders         Ordered    ibuprofen (ADVIL,MOTRIN) 600 MG tablet  Every 6 hours PRN     01/19/18 0719  tamsulosin (FLOMAX) 0.4 MG CAPS capsule  Daily     01/19/18 0719    HYDROcodone-acetaminophen (NORCO/VICODIN) 5-325 MG tablet  Every 8 hours PRN     01/19/18 0723           Follow Up: [facility] [place] [phone] Schedule an appointment as soon as possible for a visit  For close follow up to assess for renal stones     This chart was dictated using voice recognition software.  Despite best efforts to proofread,  errors can occur which can change the documentation meaning.   Fatima Blank, MD 01/20/18 [phone]

## 2018-01-19 NOTE — ED Triage Notes (Signed)
The p[t woke up 0330 from slep with lt sided  abd cramping  No n v or diarrhea  Dark colored ujrine for a few days

## 2018-01-22 ENCOUNTER — Other Ambulatory Visit: Payer: Self-pay | Admitting: Urology

## 2018-01-22 ENCOUNTER — Encounter (HOSPITAL_COMMUNITY): Payer: Self-pay | Admitting: *Deleted

## 2018-01-22 DIAGNOSIS — R311 Benign essential microscopic hematuria: Secondary | ICD-10-CM | POA: Diagnosis not present

## 2018-01-22 DIAGNOSIS — N132 Hydronephrosis with renal and ureteral calculous obstruction: Secondary | ICD-10-CM | POA: Diagnosis not present

## 2018-01-22 DIAGNOSIS — N130 Hydronephrosis with ureteropelvic junction obstruction: Secondary | ICD-10-CM | POA: Diagnosis not present

## 2018-01-22 DIAGNOSIS — N528 Other male erectile dysfunction: Secondary | ICD-10-CM | POA: Diagnosis not present

## 2018-01-22 DIAGNOSIS — N23 Unspecified renal colic: Secondary | ICD-10-CM | POA: Diagnosis not present

## 2018-01-25 ENCOUNTER — Encounter (HOSPITAL_COMMUNITY): Payer: Self-pay | Admitting: General Practice

## 2018-01-25 ENCOUNTER — Ambulatory Visit (HOSPITAL_COMMUNITY)
Admission: RE | Admit: 2018-01-25 | Discharge: 2018-01-25 | Disposition: A | Discharge status: Home / Self Care | Payer: PPO | Source: Other Acute Inpatient Hospital | Attending: Urology | Admitting: Urology

## 2018-01-25 ENCOUNTER — Ambulatory Visit (HOSPITAL_COMMUNITY): Payer: PPO

## 2018-01-25 ENCOUNTER — Encounter (HOSPITAL_COMMUNITY)
Admission: RE | Disposition: A | Discharge status: Home / Self Care | Payer: Self-pay | Source: Other Acute Inpatient Hospital | Attending: Urology

## 2018-01-25 DIAGNOSIS — N201 Calculus of ureter: Secondary | ICD-10-CM

## 2018-01-25 DIAGNOSIS — I10 Essential (primary) hypertension: Secondary | ICD-10-CM | POA: Insufficient documentation

## 2018-01-25 DIAGNOSIS — Z880 Allergy status to penicillin: Secondary | ICD-10-CM | POA: Insufficient documentation

## 2018-01-25 DIAGNOSIS — Z79899 Other long term (current) drug therapy: Secondary | ICD-10-CM | POA: Diagnosis not present

## 2018-01-25 DIAGNOSIS — Z87442 Personal history of urinary calculi: Secondary | ICD-10-CM | POA: Insufficient documentation

## 2018-01-25 DIAGNOSIS — N132 Hydronephrosis with renal and ureteral calculous obstruction: Secondary | ICD-10-CM | POA: Insufficient documentation

## 2018-01-25 DIAGNOSIS — G4730 Sleep apnea, unspecified: Secondary | ICD-10-CM | POA: Insufficient documentation

## 2018-01-25 DIAGNOSIS — G4733 Obstructive sleep apnea (adult) (pediatric): Secondary | ICD-10-CM | POA: Diagnosis not present

## 2018-01-25 DIAGNOSIS — N529 Male erectile dysfunction, unspecified: Secondary | ICD-10-CM | POA: Diagnosis not present

## 2018-01-25 DIAGNOSIS — Z01818 Encounter for other preprocedural examination: Secondary | ICD-10-CM | POA: Diagnosis not present

## 2018-01-25 HISTORY — DX: Congenital malformation of heart, unspecified: Q24.9

## 2018-01-25 HISTORY — DX: Personal history of urinary calculi: Z87.442

## 2018-01-25 HISTORY — PX: EXTRACORPOREAL SHOCK WAVE LITHOTRIPSY: SHX1557

## 2018-01-25 SURGERY — LITHOTRIPSY, ESWL
Anesthesia: LOCAL | Laterality: Left

## 2018-01-25 MED ORDER — SODIUM CHLORIDE 0.9 % IV SOLN
INTRAVENOUS | Status: DC
  Administered 2018-01-25: 07:00:00 via INTRAVENOUS

## 2018-01-25 MED ORDER — OXYCODONE HCL 5 MG PO TABS: 5.0000 mg | ORAL_TABLET | ORAL | Status: DC | PRN

## 2018-01-25 MED ORDER — DIAZEPAM 5 MG PO TABS
10.0000 mg | ORAL_TABLET | ORAL | Status: AC
  Administered 2018-01-25: 10 mg via ORAL
  Filled 2018-01-25: qty 2

## 2018-01-25 MED ORDER — DIPHENHYDRAMINE HCL 25 MG PO CAPS
25.0000 mg | ORAL_CAPSULE | ORAL | Status: AC
  Administered 2018-01-25: 25 mg via ORAL
  Filled 2018-01-25: qty 1

## 2018-01-25 MED ORDER — CIPROFLOXACIN HCL 500 MG PO TABS
500.0000 mg | ORAL_TABLET | ORAL | Status: AC
  Administered 2018-01-25: 500 mg via ORAL
  Filled 2018-01-25: qty 1

## 2018-01-25 NOTE — Op Note (Signed)
See [facility] OP note scanned into chart. Also because of the size, density, location and other factors that cannot be anticipated I feel this will likely be a staged procedure. This fact supersedes any indication in the scanned [facility] operative note to the contrary.  

## 2018-01-25 NOTE — H&P (Signed)
CC: Left flank pain   HPI: Ricardo Morris is a 67 year old male with a 72 hour history of left sided flan pain. He describes the pain as intermittent, crampy, non-radiating and is partially alleviated with toradol. He denies nausea/vomiting or fever, but does report chills yesterday. AFVSS today, but he is still having intermittent episodes of pain. UA still show blood. He does have a history of kidney stones (10 years ago), but has never required surgery.   He was seen on the [facility] ED on 01/19/18 and was found to have mild left hydro on RUS.   CTSS from the office today shows a 6.5 mm ureteral stone at the level of the L4-L5 disc space associated with moderate left hydronephrosis (final read pending).     CC: I am having trouble with my erections.  HPI: Ricardo Morris is a 67 year-old male patient who is here for erectile dysfunction.  He first stated noticing pain on approximately 01/03/2016. His symptoms did begin gradually. His symptoms have been worse over the last year.   He does have difficulties achieving an erection. He does have problems maintaining his erections. His erections are straight. He has tried Viagra. It did work.   No recent or prior angina/MI/CVA. Denies taking nitrates for chest pain.      ALLERGIES: Penicillin    MEDICATIONS: Tamsulosin Hcl 0.4 mg capsule  Amitriptyline Hcl 10 mg tablet  Hydrocodone-Acetaminophen 5 mg-325 mg tablet  Ibuprofen  Losartan Potassium 50 mg tablet  Pantoprazole Sodium 40 mg vial  Triazolam 0.25 mg tablet     Notes: MEDICATIONS VERIFIED TODAY. AT    GU PSH: None   NON-GU PSH: Appendectomy - about 1956 Nose Surgery (Unspecified) - about 2004    GU PMH: None   NON-GU PMH: Arthritis GERD Glaucoma Hypertension Sleep Apnea    FAMILY HISTORY: 1 son - Son Asthma - Mother, Father Death In The Family Father - Father Death In The Family Mother - Mother Tuberculosis - Grandmother   SOCIAL HISTORY: Marital Status: Divorced Preferred  Language: English; Ethnicity: Not Hispanic Or Latino; Race: White Current Smoking Status: Patient has never smoked.   Tobacco Use Assessment Completed: Used Tobacco in last 30 days? Does not use drugs. Does not drink caffeine. Has not had a blood transfusion.    REVIEW OF SYSTEMS:    GU Review Male:   Patient denies frequent urination, hard to postpone urination, burning/ pain with urination, get up at night to urinate, leakage of urine, stream starts and stops, trouble starting your stream, have to strain to urinate , erection problems, and penile pain.  Gastrointestinal (Upper):   Patient denies nausea, vomiting, and indigestion/ heartburn.  Gastrointestinal (Lower):   Patient denies diarrhea and constipation.  Constitutional:   Patient denies fever, fatigue, weight loss, and night sweats.  Skin:   Patient denies skin rash/ lesion and itching.  Eyes:   Patient denies blurred vision and double vision.  Ears/ Nose/ Throat:   Patient denies sore throat and sinus problems.  Hematologic/Lymphatic:   Patient denies swollen glands and easy bruising.  Cardiovascular:   Patient denies leg swelling and chest pains.  Respiratory:   Patient denies cough and shortness of breath.  Endocrine:   Patient denies excessive thirst.  Musculoskeletal:   Patient reports back pain and joint pain.   Neurological:   Patient denies headaches and dizziness.  Psychologic:   Patient denies depression and anxiety.   Notes: PT STATES HE DOES HAVE ANY URINARY SYMPTOMS.  PAIN ON LEFT SIDE. AT     VITAL SIGNS:      01/22/2018 09:22 AM  Weight 205 lb / 92.99 kg  Height 71 in / 180.34 cm  BP 162/94 mmHg  Pulse 116 /min  Temperature 98.0 F / 36.6 C  BMI 28.6 kg/m   MULTI-SYSTEM PHYSICAL EXAMINATION:    Constitutional: Well-nourished. No physical deformities. Normally developed. Good grooming.  Neck: Neck symmetrical, not swollen. Normal tracheal position.  Respiratory: No labored breathing, no use of accessory  muscles.   Cardiovascular: Normal temperature, normal extremity pulses, no swelling, no varicosities.  Lymphatic: No enlargement of neck, axillae, groin.  Skin: No paleness, no jaundice, no cyanosis. No lesion, no ulcer, no rash.  Neurologic / Psychiatric: Oriented to time, oriented to place, oriented to person. No depression, no anxiety, no agitation.  Gastrointestinal: No mass, no tenderness, no rigidity, non obese abdomen.  Eyes: Normal conjunctivae. Normal eyelids.  Ears, Nose, Mouth, and Throat: Left ear no scars, no lesions, no masses. Right ear no scars, no lesions, no masses. Nose no scars, no lesions, no masses. Normal hearing. Normal lips.  Musculoskeletal: Normal gait and station of head and neck.     PAST DATA REVIEWED:  Source Of History:  Patient   PROCEDURES:         C.T. Urogram - [id number]               Urinalysis w/Scope Dipstick Dipstick Cont'd Micro  Color: Yellow Bilirubin: Neg mg/dL WBC/hpf: 0 - 5/hpf  Appearance: Clear Ketones: Neg mg/dL RBC/hpf: 3 - 10/hpf  Specific Gravity: 1.020 Blood: Trace ery/uL Bacteria: NS (Not Seen)  pH: <=5.0 Protein: Trace mg/dL Cystals: NS (Not Seen)  Glucose: Neg mg/dL Urobilinogen: 0.2 mg/dL Casts: Granular    Nitrites: Neg Trichomonas: Not Present    Leukocyte Esterase: Neg leu/uL Mucous: Present      Epithelial Cells: 0 - 5/hpf      Yeast: NS (Not Seen)      Sperm: Not Present    ASSESSMENT:      ICD-10 Details  1 GU:   Renal colic - M09 Left  2   Microscopic hematuria - R31.1   3   History of urolithiasis - Z87.442   4   Hydronephrosis - N13.0 Left  5   Other male ED - N52.8    PLAN:           Orders X-Rays: C.T. Stone Protocol Without Contrast  X-Ray Notes: History:  Hematuria: Yes/No  Patient to see MD after exam: Yes/No  Previous exam: CT / IVP/ US/ KUB/ None  When:  Where:  Diabetic: Yes/ No  BUN/ Creatinine:  Date of last BUN Creatinine:  Weight in pounds:  Allergy- IV Contrast: Yes/  No  Conflicting diabetic meds: Yes/ No  Diabetic Meds:  Prior Authorization #: [id number]           Schedule Return Visit/Planned Activity: ASAP - Schedule Surgery          Document Letter(s):  Created for Patient: Clinical Summary   Created for Lance Muss, MD         Notes:   We discussed the management of kidney stones. These options include observation, ureteroscopy, shockwave lithotripsy (ESWL) and percutaneous nephrolithotomy (PCNL). We discussed which options are relevant to the patient's stone(s). We discussed the natural history of kidney stones as well as the complications of untreated stones and the impact on quality of life without treatment as well as with  each of the above listed treatments. We also discussed the efficacy of each treatment in its ability to clear the stone burden. With any of these management options I discussed the signs and symptoms of infection and the need for emergent treatment should these be experienced. For each option we discussed the ability of each procedure to clear the patient of their stone burden.   For observation I described the risks which include but are not limited to silent renal damage, life-threatening infection, need for emergent surgery, failure to pass stone and pain.   For ureteroscopy I described the risks which include bleeding, infection, damage to contiguous structures, positioning injury, ureteral stricture, ureteral avulsion, ureteral injury, need for prolonged ureteral stent, inability to perform ureteroscopy, need for an interval procedure, inability to clear stone burden, stent discomfort/pain, heart attack, stroke, pulmonary embolus and the inherent risks with general anesthesia.   For shockwave lithotripsy I described the risks which include arrhythmia, kidney contusion, kidney hemorrhage, need for transfusion, pain, inability to adequately break up stone, inability to pass stone fragments, Steinstrasse, infection associated  with obstructing stones, need for alternate surgical procedure, need for repeat shockwave lithotripsy, MI, CVA, PE and the inherent risks with anesthesia/conscious sedation.   For PCNL I described the risks including positioning injury, pneumothorax, hydrothorax, need for chest tube, inability to clear stone burden, renal laceration, arterial venous fistula or malformation, need for embolization of kidney, loss of kidney or renal function, need for repeat procedure, need for prolonged nephrostomy tube, ureteral avulsion, MI, CVA, PE and the inherent risks of general anesthesia   He would like to proceed with LEFT ESWL. We discussed criteria to return to clinic or proceed to the ER which include: Fever/chills, worsening pain, nausea/vomiting and/or persistent gross hematuria. He voices understanding.

## 2018-01-25 NOTE — Discharge Instructions (Signed)
See [facility] discharge instructions in chart.  

## 2018-01-25 NOTE — Progress Notes (Signed)
PACU PHASE II NSG NOTE: pt's BP was elevated during phase I and remained elevated during the procedure.  Louis Meckel MD aware per [facility]

## 2018-01-26 ENCOUNTER — Encounter (HOSPITAL_COMMUNITY): Payer: Self-pay | Admitting: Urology

## 2018-01-29 DIAGNOSIS — I10 Essential (primary) hypertension: Secondary | ICD-10-CM | POA: Diagnosis not present

## 2018-01-29 DIAGNOSIS — Z125 Encounter for screening for malignant neoplasm of prostate: Secondary | ICD-10-CM | POA: Diagnosis not present

## 2018-01-29 DIAGNOSIS — H20011 Primary iridocyclitis, right eye: Secondary | ICD-10-CM | POA: Diagnosis not present

## 2018-01-30 DIAGNOSIS — N200 Calculus of kidney: Secondary | ICD-10-CM | POA: Diagnosis not present

## 2018-01-30 DIAGNOSIS — I351 Nonrheumatic aortic (valve) insufficiency: Secondary | ICD-10-CM | POA: Diagnosis not present

## 2018-02-06 DIAGNOSIS — H20011 Primary iridocyclitis, right eye: Secondary | ICD-10-CM | POA: Diagnosis not present

## 2018-02-08 DIAGNOSIS — N201 Calculus of ureter: Secondary | ICD-10-CM | POA: Diagnosis not present

## 2018-02-26 DIAGNOSIS — N201 Calculus of ureter: Secondary | ICD-10-CM | POA: Diagnosis not present

## 2018-02-27 DIAGNOSIS — B352 Tinea manuum: Secondary | ICD-10-CM | POA: Diagnosis not present

## 2018-02-27 DIAGNOSIS — L821 Other seborrheic keratosis: Secondary | ICD-10-CM | POA: Diagnosis not present

## 2018-02-27 DIAGNOSIS — B351 Tinea unguium: Secondary | ICD-10-CM | POA: Diagnosis not present

## 2018-02-27 DIAGNOSIS — L218 Other seborrheic dermatitis: Secondary | ICD-10-CM | POA: Diagnosis not present

## 2018-02-27 DIAGNOSIS — D2222 Melanocytic nevi of left ear and external auricular canal: Secondary | ICD-10-CM | POA: Diagnosis not present

## 2018-02-27 DIAGNOSIS — D1801 Hemangioma of skin and subcutaneous tissue: Secondary | ICD-10-CM | POA: Diagnosis not present

## 2018-02-27 DIAGNOSIS — D2361 Other benign neoplasm of skin of right upper limb, including shoulder: Secondary | ICD-10-CM | POA: Diagnosis not present

## 2018-03-05 ENCOUNTER — Encounter: Payer: Self-pay | Admitting: Adult Health

## 2018-03-05 ENCOUNTER — Ambulatory Visit: Payer: PPO | Admitting: Adult Health

## 2018-03-05 DIAGNOSIS — G4700 Insomnia, unspecified: Secondary | ICD-10-CM

## 2018-03-05 DIAGNOSIS — G4733 Obstructive sleep apnea (adult) (pediatric): Secondary | ICD-10-CM

## 2018-03-05 NOTE — Assessment & Plan Note (Signed)
Controlled on CPAP  Compliance encouarged  Healthy sleep regimen   Plan  Patient Instructions  Continue on CPap at bedtime Keep up good work .  Do not drive if sleepy Work on healthy weight .  Follow-up with Dr. Elsworth Soho  In 1 year and As needed

## 2018-03-05 NOTE — Progress Notes (Signed)
@Patient  ID: Ricardo Morris, male    DOB: 03/06/1951, 67 y.o.   MRN: [id number]  Chief Complaint  Patient presents with  . Follow-up    OSA     Referring provider: Levin Erp, MD  HPI: 67 yo male followed for OSA  S/p UPPP   TEST /Events  PSG 2006 showed RDI of 13 events per hour corrected by CPAP of 7 cm with a full face mask and lowest desaturation of 89%   03/05/2018 Follow up : OSA  Patient presents for a one-year follow-up.  Patient has underlying sleep apnea.  Patient is on CPAP at bedtime.  Patient says he is doing well on CPAP.  Download shows excellent compliance with average usage at 5.5 hours.  Patient is on CPAP 7 cm H2O.  AHI is 1.2. Patient says he has some insomnia currently taking amitriptyline 10 mg at bedtime and triazolam 0.25 mg at bedtime.  Patient says this does help. Patient says he feels rested.   Allergies  Allergen Reactions  . Penicillins     As child    Immunization History  Administered Date(s) Administered  . Influenza Inj Mdck Quad Pf 12/04/2017  . Influenza Split 01/02/2013  . Influenza Whole 01/02/2009  . Influenza-Unspecified 01/15/2014  . Pneumococcal Conjugate-13 02/03/2018  . Zoster 04/05/2011    Past Medical History:  Diagnosis Date  . Anxiety   . Colon polyp   . GERD (gastroesophageal reflux disease)   . Heart abnormality    slight enlarged arota- no change since previous ekg  . History of kidney stones   . Kidney stones    x1  . Sleep apnea    uses c-pap    Tobacco History: Social History   Tobacco Use  Smoking Status Never Smoker  Smokeless Tobacco Never Used   Counseling given: Not Answered   Outpatient Medications Prior to Visit  Medication Sig Dispense Refill  . amitriptyline (ELAVIL) 10 MG tablet Take 10 mg by mouth at bedtime.     . cyclopentolate (CYCLODRYL,CYCLOGYL) 1 % ophthalmic solution Place 1 drop into the right eye 3 (three) times daily.  0  . ibuprofen (ADVIL,MOTRIN) 600 MG tablet Take 1 tablet  (600 mg total) by mouth every 6 (six) hours as needed. 30 tablet 0  . losartan (COZAAR) 50 MG tablet Take 50 mg by mouth daily.    . pantoprazole (PROTONIX) 40 MG tablet Take 40 mg by mouth daily.     . triazolam (HALCION) 0.25 MG tablet Take 0.25 mg by mouth at bedtime and may repeat dose one time if needed.     No facility-administered medications prior to visit.      Review of Systems  Constitutional:   No  weight loss, night sweats,  Fevers, chills, fatigue, or  lassitude.  HEENT:   No headaches,  Difficulty swallowing,  Tooth/dental problems, or  Sore throat,                No sneezing, itching, ear ache, nasal congestion, post nasal drip,   CV:  No chest pain,  Orthopnea, PND, swelling in lower extremities, anasarca, dizziness, palpitations, syncope.   GI  No heartburn, indigestion, abdominal pain, nausea, vomiting, diarrhea, change in bowel habits, loss of appetite, bloody stools.   Resp: No shortness of breath with exertion or at rest.  No excess mucus, no productive cough,  No non-productive cough,  No coughing up of blood.  No change in color of mucus.  No wheezing.  No chest wall deformity  Skin: no rash or lesions.  GU: no dysuria, change in color of urine, no urgency or frequency.  No flank pain, no hematuria   MS:  No joint pain or swelling.  No decreased range of motion.  No back pain.    Physical Exam  BP 140/78 (BP Location: Left Arm, Cuff Size: Normal)   Pulse 88   Ht 6\' 1"  (1.854 m)   Wt 215 lb (97.5 kg)   SpO2 97%   BMI 28.37 kg/m   GEN: A/Ox3; pleasant , NAD, well nourished    HEENT:  [place]/AT,  EACs-clear, TMs-wnl, NOSE-clear, THROAT-clear, no lesions, no postnasal drip or exudate noted. Class 2-3 MP airway   NECK:  Supple w/ fair ROM; no JVD; normal carotid impulses w/o bruits; no thyromegaly or nodules palpated; no lymphadenopathy.    RESP  Clear  P & A; w/o, wheezes/ rales/ or rhonchi. no accessory muscle use, no dullness to percussion  CARD:   RRR, no m/r/g, no peripheral edema, pulses intact, no cyanosis or clubbing.  GI:   Soft & nt; nml bowel sounds; no organomegaly or masses detected.   Musco: Warm bil, no deformities or joint swelling noted.   Neuro: alert, no focal deficits noted.    Skin: Warm, no lesions or rashes    Lab Results:  CBC No results found for: WBC, RBC, HGB, HCT, PLT, MCV, MCH, MCHC, RDW, LYMPHSABS, MONOABS, EOSABS, BASOSABS  BMET No results found for: NA, K, CL, CO2, GLUCOSE, BUN, CREATININE, CALCIUM, GFRNONAA, GFRAA  BNP No results found for: BNP  ProBNP No results found for: PROBNP  Imaging: No results found.    No flowsheet data found.  No results found for: NITRICOXIDE      Assessment & Plan:   OSA (obstructive sleep apnea) Controlled on CPAP  Compliance encouarged  Healthy sleep regimen   Plan  Patient Instructions  Continue on CPap at bedtime Keep up good work .  Do not drive if sleepy Work on healthy weight .  Follow-up with Dr. Elsworth Soho  In 1 year and As needed        INSOMNIA Cont on current regimen  Healthy sleep regimen       Rexene Edison, NP 03/05/2018

## 2018-03-05 NOTE — Patient Instructions (Signed)
Continue on CPap at bedtime Keep up good work .  Do not drive if sleepy Work on healthy weight .  Follow-up with Dr. Elsworth Soho  In 1 year and As needed

## 2018-03-05 NOTE — Assessment & Plan Note (Signed)
Cont on current regimen  Healthy sleep regimen

## 2018-03-16 DIAGNOSIS — N201 Calculus of ureter: Secondary | ICD-10-CM | POA: Diagnosis not present

## 2018-03-26 DIAGNOSIS — R799 Abnormal finding of blood chemistry, unspecified: Secondary | ICD-10-CM | POA: Diagnosis not present

## 2018-03-29 DIAGNOSIS — M545 Low back pain: Secondary | ICD-10-CM | POA: Diagnosis not present

## 2018-03-29 DIAGNOSIS — A0839 Other viral enteritis: Secondary | ICD-10-CM | POA: Diagnosis not present

## 2018-05-14 DIAGNOSIS — H40013 Open angle with borderline findings, low risk, bilateral: Secondary | ICD-10-CM | POA: Diagnosis not present

## 2018-05-14 DIAGNOSIS — H20011 Primary iridocyclitis, right eye: Secondary | ICD-10-CM | POA: Diagnosis not present

## 2018-05-14 DIAGNOSIS — H2513 Age-related nuclear cataract, bilateral: Secondary | ICD-10-CM | POA: Diagnosis not present

## 2018-06-05 DIAGNOSIS — H20011 Primary iridocyclitis, right eye: Secondary | ICD-10-CM | POA: Diagnosis not present

## 2018-06-19 DIAGNOSIS — H30031 Focal chorioretinal inflammation, peripheral, right eye: Secondary | ICD-10-CM

## 2018-06-19 DIAGNOSIS — H2513 Age-related nuclear cataract, bilateral: Secondary | ICD-10-CM

## 2018-06-19 DIAGNOSIS — H35033 Hypertensive retinopathy, bilateral: Secondary | ICD-10-CM

## 2018-06-19 DIAGNOSIS — H3581 Retinal edema: Secondary | ICD-10-CM

## 2018-06-19 DIAGNOSIS — H21541 Posterior synechiae (iris), right eye: Secondary | ICD-10-CM | POA: Insufficient documentation

## 2018-06-19 HISTORY — DX: Retinal edema: H35.81

## 2018-06-19 HISTORY — DX: Hypertensive retinopathy, bilateral: H35.033

## 2018-06-19 HISTORY — DX: Age-related nuclear cataract, bilateral: H25.13

## 2018-06-19 HISTORY — DX: Focal chorioretinal inflammation, peripheral, right eye: H30.031

## 2018-06-19 HISTORY — DX: Posterior synechiae (iris), right eye: H21.541

## 2018-06-27 DIAGNOSIS — H30031 Focal chorioretinal inflammation, peripheral, right eye: Secondary | ICD-10-CM | POA: Diagnosis not present

## 2018-07-10 DIAGNOSIS — H30031 Focal chorioretinal inflammation, peripheral, right eye: Secondary | ICD-10-CM | POA: Diagnosis not present

## 2018-07-10 DIAGNOSIS — R300 Dysuria: Secondary | ICD-10-CM | POA: Diagnosis not present

## 2018-07-10 DIAGNOSIS — B009 Herpesviral infection, unspecified: Secondary | ICD-10-CM | POA: Diagnosis not present

## 2018-07-10 DIAGNOSIS — L0100 Impetigo, unspecified: Secondary | ICD-10-CM | POA: Diagnosis not present

## 2018-07-10 DIAGNOSIS — H2513 Age-related nuclear cataract, bilateral: Secondary | ICD-10-CM | POA: Diagnosis not present

## 2018-07-10 DIAGNOSIS — H35033 Hypertensive retinopathy, bilateral: Secondary | ICD-10-CM | POA: Diagnosis not present

## 2018-07-10 DIAGNOSIS — Z23 Encounter for immunization: Secondary | ICD-10-CM | POA: Diagnosis not present

## 2018-07-10 DIAGNOSIS — H21541 Posterior synechiae (iris), right eye: Secondary | ICD-10-CM | POA: Diagnosis not present

## 2018-07-10 DIAGNOSIS — H3581 Retinal edema: Secondary | ICD-10-CM | POA: Diagnosis not present

## 2018-07-23 DIAGNOSIS — N390 Urinary tract infection, site not specified: Secondary | ICD-10-CM | POA: Diagnosis not present

## 2018-08-15 IMAGING — CR DG SINUSES COMPLETE 3+V
4 series · 4 of 4 positions shown · non-contrast
Comparison: None.

CLINICAL DATA: Fall, facial injury

EXAM:
PARANASAL SINUSES - COMPLETE 3 + VIEW

[[person_name] pa]
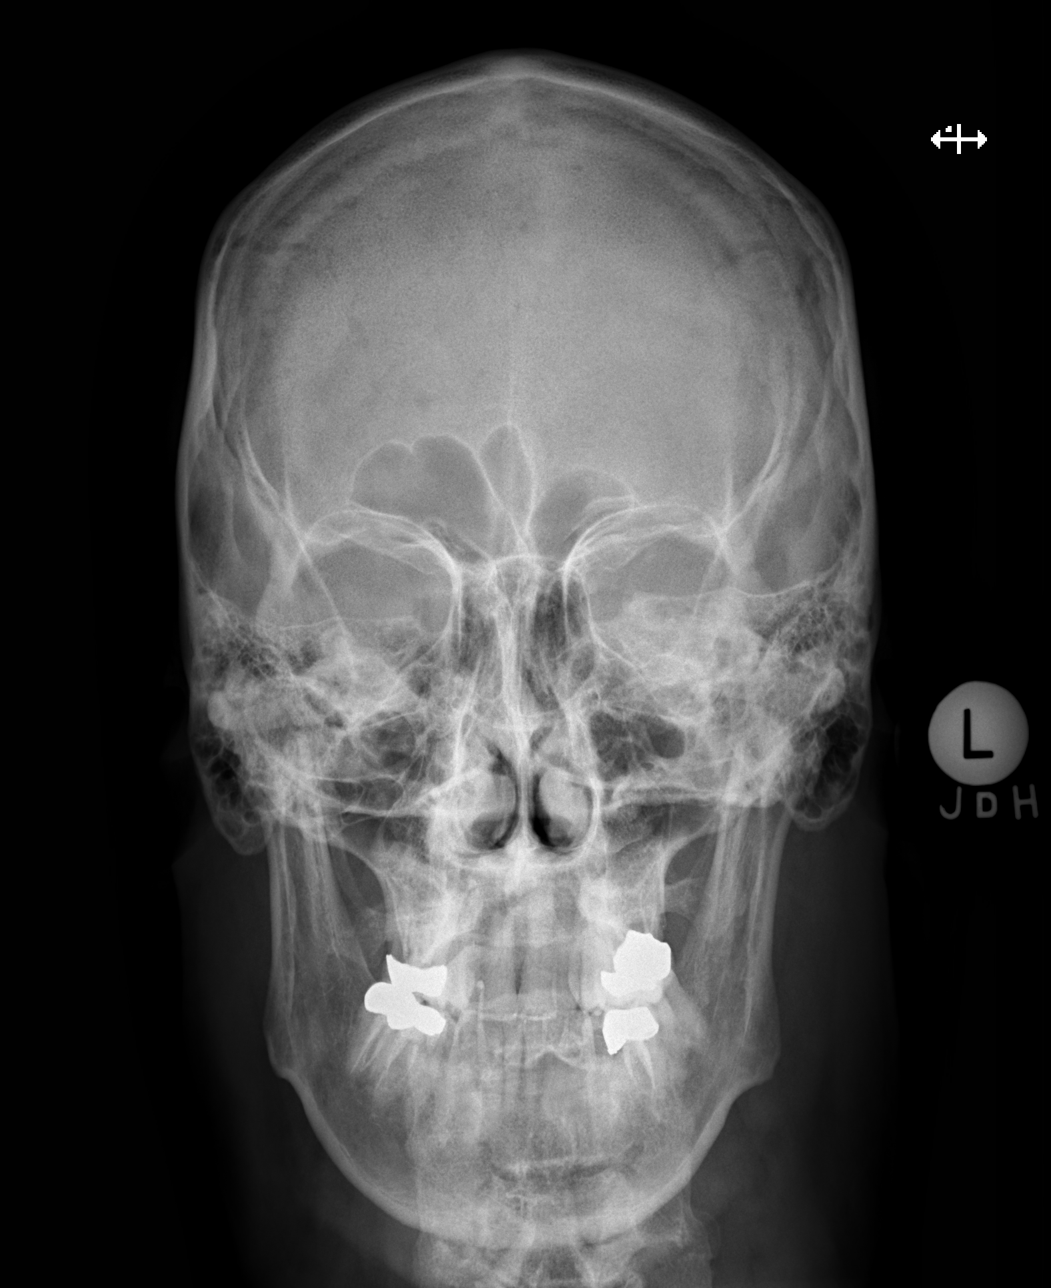

[w waters pa]
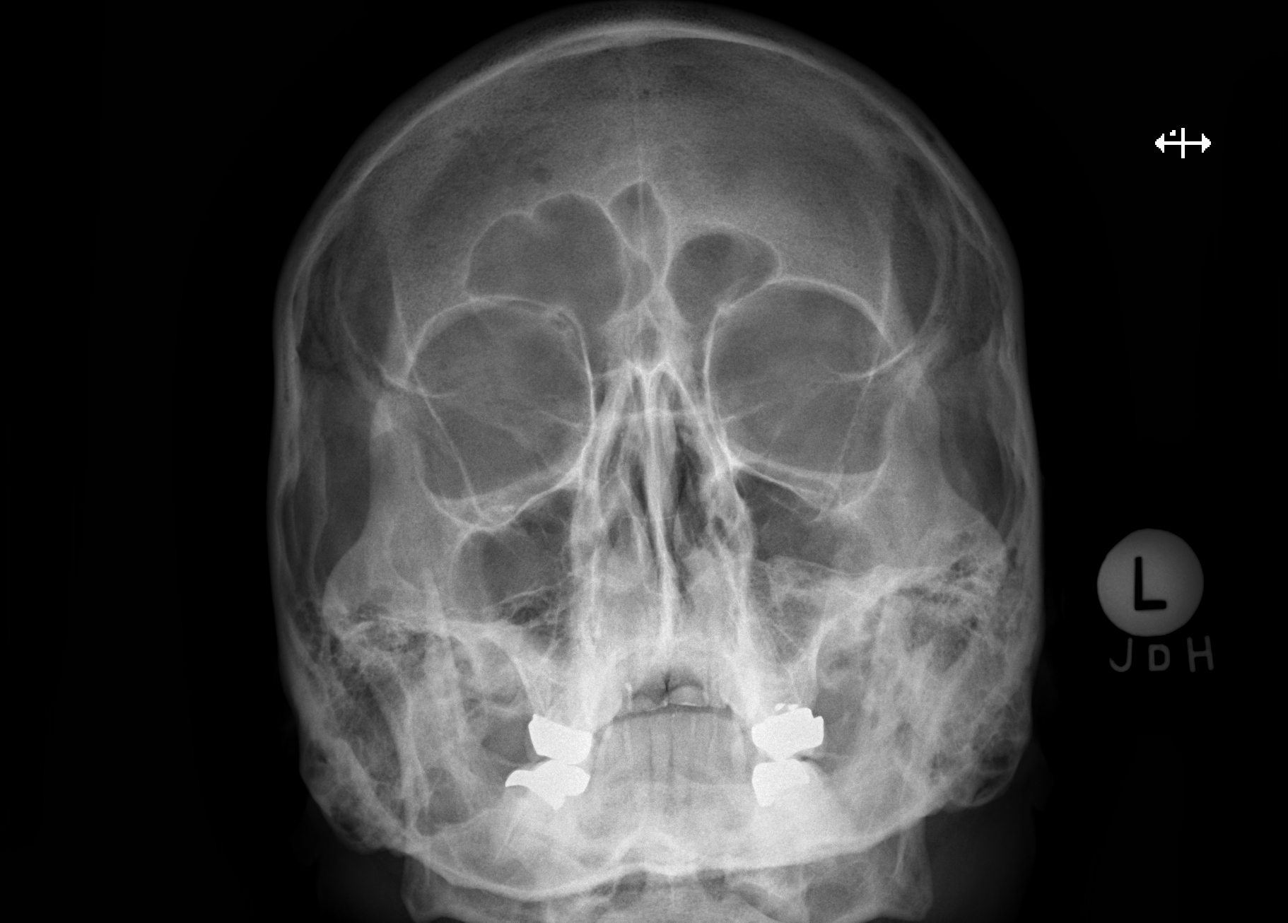

[w skull lat]
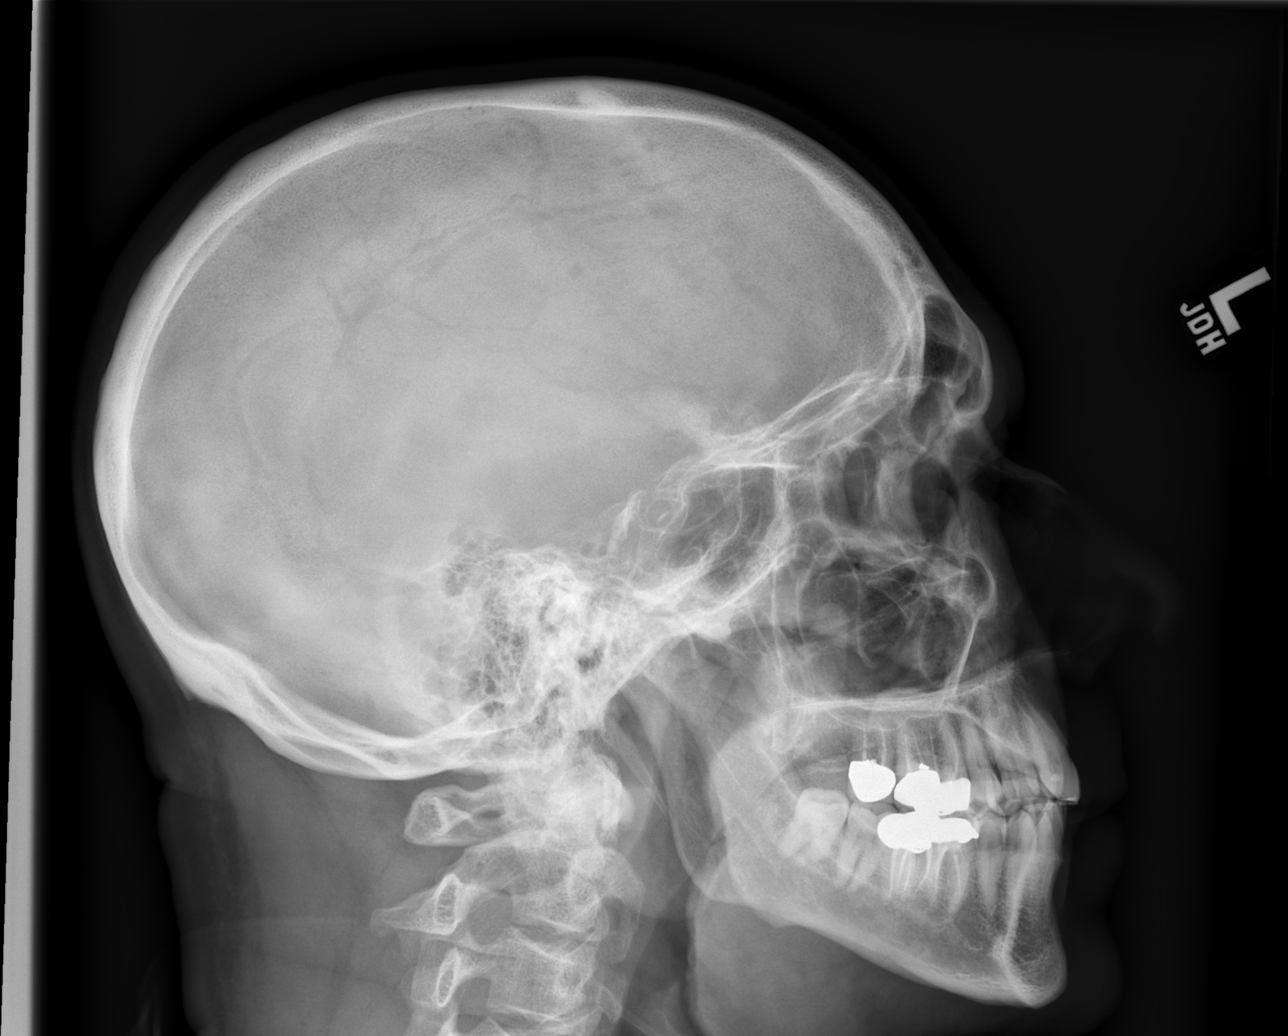

[w smv]
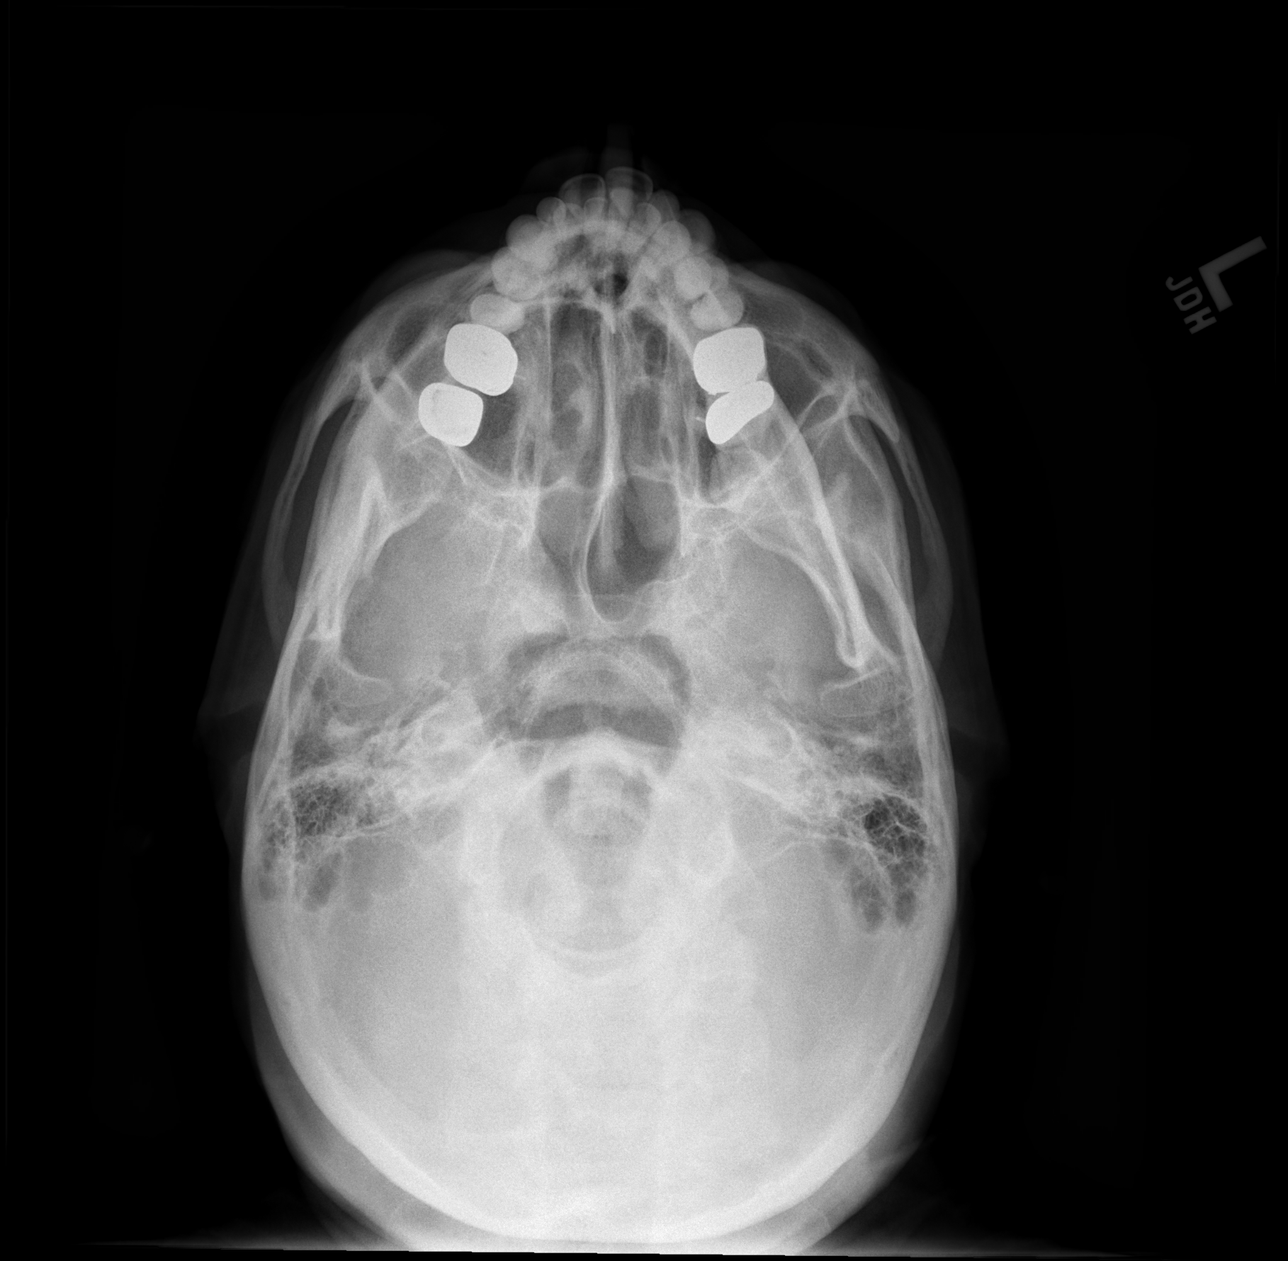

[4 of 4 positions shown; findings below may reference images not displayed]

FINDINGS: The paranasal sinus are aerated. There is no evidence of sinus
opacification air-fluid levels or mucosal thickening. No significant
bone abnormalities are seen. No visible facial fracture. No orbital
emphysema.
IMPRESSION: No acute bony abnormality.

## 2018-09-03 DIAGNOSIS — N5201 Erectile dysfunction due to arterial insufficiency: Secondary | ICD-10-CM | POA: Diagnosis not present

## 2018-09-18 DIAGNOSIS — H35033 Hypertensive retinopathy, bilateral: Secondary | ICD-10-CM | POA: Diagnosis not present

## 2018-09-18 DIAGNOSIS — H2513 Age-related nuclear cataract, bilateral: Secondary | ICD-10-CM | POA: Diagnosis not present

## 2018-09-18 DIAGNOSIS — H21541 Posterior synechiae (iris), right eye: Secondary | ICD-10-CM | POA: Diagnosis not present

## 2018-09-18 DIAGNOSIS — H30031 Focal chorioretinal inflammation, peripheral, right eye: Secondary | ICD-10-CM | POA: Diagnosis not present

## 2018-09-18 DIAGNOSIS — H3581 Retinal edema: Secondary | ICD-10-CM | POA: Diagnosis not present

## 2018-10-10 DIAGNOSIS — Z20828 Contact with and (suspected) exposure to other viral communicable diseases: Secondary | ICD-10-CM | POA: Diagnosis not present

## 2018-11-07 ENCOUNTER — Ambulatory Visit: Payer: PPO | Admitting: Cardiology

## 2018-11-12 DIAGNOSIS — H20011 Primary iridocyclitis, right eye: Secondary | ICD-10-CM | POA: Diagnosis not present

## 2018-11-12 DIAGNOSIS — H40013 Open angle with borderline findings, low risk, bilateral: Secondary | ICD-10-CM | POA: Diagnosis not present

## 2018-11-12 DIAGNOSIS — H2513 Age-related nuclear cataract, bilateral: Secondary | ICD-10-CM | POA: Diagnosis not present

## 2018-11-19 ENCOUNTER — Encounter: Payer: Self-pay | Admitting: Cardiology

## 2018-11-19 ENCOUNTER — Other Ambulatory Visit: Payer: Self-pay

## 2018-11-19 ENCOUNTER — Ambulatory Visit (INDEPENDENT_AMBULATORY_CARE_PROVIDER_SITE_OTHER): Payer: PPO | Admitting: Cardiology

## 2018-11-19 VITALS — BP 150/70 | HR 73 | Ht 73.0 in | Wt 212.8 lb

## 2018-11-19 DIAGNOSIS — I712 Thoracic aortic aneurysm, without rupture, unspecified: Secondary | ICD-10-CM | POA: Insufficient documentation

## 2018-11-19 DIAGNOSIS — Z8679 Personal history of other diseases of the circulatory system: Secondary | ICD-10-CM

## 2018-11-19 DIAGNOSIS — G4733 Obstructive sleep apnea (adult) (pediatric): Secondary | ICD-10-CM

## 2018-11-19 DIAGNOSIS — I422 Other hypertrophic cardiomyopathy: Secondary | ICD-10-CM | POA: Diagnosis not present

## 2018-11-19 DIAGNOSIS — I7121 Aneurysm of the ascending aorta, without rupture: Secondary | ICD-10-CM

## 2018-11-19 HISTORY — DX: Thoracic aortic aneurysm, without rupture: I71.2

## 2018-11-19 HISTORY — DX: Personal history of other diseases of the circulatory system: Z86.79

## 2018-11-19 HISTORY — DX: Aneurysm of the ascending aorta, without rupture: I71.21

## 2018-11-19 NOTE — Patient Instructions (Signed)
Medication Instructions:  Your physician recommends that you continue on your current medications as directed. Please refer to the Current Medication list given to you today.  If you need a refill on your cardiac medications before your next appointment, please call your pharmacy.   Lab work: Your physician recommends that you return for lab work: BMP  If you have labs (blood work) drawn today and your tests are completely normal, you will receive your results only by:  [place] (if you have MyChart) OR  A paper copy in the mail If you have any lab test that is abnormal or we need to change your treatment, we will call you to review the results.  Testing/Procedures: Your physician has requested that you have an echocardiogram. Echocardiography is a painless test that uses sound waves to create images of your heart. It provides your doctor with information about the size and shape of your heart and how well your hearts chambers and valves are working. This procedure takes approximately one hour. There are no restrictions for this procedure.  Non-Cardiac CT Angiography (CTA), is a special type of CT scan that uses a computer to produce multi-dimensional views of major blood vessels throughout the body. In CT angiography, a contrast material is injected through an IV to help visualize the blood vessels  Your physician has recommended that you wear a holter monitor. Holter monitors are medical devices that record the hearts electrical activity. Doctors most often use these monitors to diagnose arrhythmias. Arrhythmias are problems with the speed or rhythm of the heartbeat. The monitor is a small, portable device. You can wear one while you do your normal daily activities. This is usually used to diagnose what is causing palpitations/syncope (passing out).  WEAR FOR 14 DAYS    Follow-Up: At [facility], you and your health needs are our priority.  As part of our continuing mission  to provide you with exceptional heart care, we have created designated Provider Care Teams.  These Care Teams include your primary Cardiologist (physician) and Advanced Practice Providers (APPs -  Physician Assistants and Nurse Practitioners) who all work together to provide you with the care you need, when you need it. You will need a follow up appointment in 6 months.  Please call our office 2 months in advance to schedule this appointment.  You may see No primary care provider on file. or another member of our [employer] Provider Team in [place]: Shirlee More, MD  Jyl Heinz, MD  Any Other Special Instructions Will Be Listed Below (If Applicable).   Echocardiogram An echocardiogram is a procedure that uses painless sound waves (ultrasound) to produce an image of the heart. Images from an echocardiogram can provide important information about: Signs of coronary artery disease (CAD). Aneurysm detection. An aneurysm is a weak or damaged part of an artery wall that bulges out from the normal force of blood pumping through the body. Heart size and shape. Changes in the size or shape of the heart can be associated with certain conditions, including heart failure, aneurysm, and CAD. Heart muscle function. Heart valve function. Signs of a past heart attack. Fluid buildup around the heart. Thickening of the heart muscle. A tumor or infectious growth around the heart valves. Tell a health care provider about: Any allergies you have. All medicines you are taking, including vitamins, herbs, eye drops, creams, and over-the-counter medicines. Any blood disorders you have. Any surgeries you have had. Any medical conditions you have. Whether you  are pregnant or may be pregnant. What are the risks? Generally, this is a safe procedure. However, problems may occur, including: Allergic reaction to dye (contrast) that may be used during the procedure. What happens before the procedure? No  specific preparation is needed. You may eat and drink normally. What happens during the procedure?  An IV tube may be inserted into one of your veins. You may receive contrast through this tube. A contrast is an injection that improves the quality of the pictures from your heart. A gel will be applied to your chest. A wand-like tool (transducer) will be moved over your chest. The gel will help to transmit the sound waves from the transducer. The sound waves will harmlessly bounce off of your heart to allow the heart images to be captured in real-time motion. The images will be recorded on a computer. The procedure may vary among health care providers and hospitals. What happens after the procedure? You may return to your normal, everyday life, including diet, activities, and medicines, unless your health care provider tells you not to do that. Summary An echocardiogram is a procedure that uses painless sound waves (ultrasound) to produce an image of the heart. Images from an echocardiogram can provide important information about the size and shape of your heart, heart muscle function, heart valve function, and fluid buildup around your heart. You do not need to do anything to prepare before this procedure. You may eat and drink normally. After the echocardiogram is completed, you may return to your normal, everyday life, unless your health care provider tells you not to do that. This information is not intended to replace advice given to you by your health care provider. Make sure you discuss any questions you have with your health care provider. Document Released: 03/18/2000 Document Revised: 07/12/2018 Document Reviewed: 04/23/2016 Elsevier Patient Education  [place].   CT Angiogram  A CT angiogram is a procedure to look at the blood vessels in various areas of the body. For this procedure, a large X-ray machine, called a CT scanner, takes detailed pictures of blood vessels that  have been injected with a dye (contrast material). A CT angiogram allows your health care provider to see how well blood is flowing to the area of your body that is being checked. Your health care provider will be able to see if there are any problems, such as a blockage. Tell a health care provider about:  Any allergies you have.  All medicines you are taking, including vitamins, herbs, eye drops, creams, and over-the-counter medicines.  Any problems you or family members have had with anesthetic medicines.  Any blood disorders you have.  Any surgeries you have had.  Any medical conditions you have.  Whether you are pregnant or may be pregnant.  Whether you are breastfeeding.  Any anxiety disorders, chronic pain, or other conditions you have that may increase your stress or prevent you from lying still. What are the risks? Generally, this is a safe procedure. However, problems may occur, including:  Infection.  Bleeding.  Allergic reactions to medicines or dyes.  Damage to other structures or organs.  Kidney damage from the dye or contrast that is used.  Increased risk of cancer from radiation exposure. This risk is low. Talk with your health care provider about: ? The risks and benefits of testing. ? How you can receive the lowest dose of radiation. What happens before the procedure?  Wear comfortable clothing and remove any jewelry.  Follow instructions from your health care provider about eating and drinking. For most people, instructions may include these actions: ? For 12 hours before the test, avoid caffeine. This includes tea, coffee, soda, and energy drinks or pills. ? For 3-4 hours before the test, stop eating or drinking anything but water. ? Stay well hydrated by continuing to drink water before the exam. This will help to clear the contrast dye from your body after the test.  Ask your health care provider about changing or stopping your regular medicines.  This is especially important if you are taking diabetes medicines or blood thinners. What happens during the procedure?  An IV tube will be inserted into one of your veins.  You will be asked to lie on an exam table. This table will slide in and out of the CT machine during the procedure.  Contrast dye will be injected into the IV tube. You might feel warm, or you may get a metallic taste in your mouth.  The table that you are lying on will move into the CT machine tunnel for the scan.  The person running the machine will give you instructions while the scans are being done. You may be asked to: ? Keep your arms above your head. ? Hold your breath. ? Stay very still, even if the table is moving.  When the scanning is complete, you will be moved out of the machine.  The IV tube will be removed. The procedure may vary among health care providers and hospitals. What happens after the procedure?  You might feel warm, or you may get a metallic taste in your mouth.  You may be asked to drink water or other fluids to wash (flush) the contrast material out of your body.  It is up to you to get the results of your procedure. Ask your health care provider, or the department that is doing the procedure, when your results will be ready. Summary  A CT angiogram is a procedure to look at the blood vessels in various areas of the body.  You will need to stay very still during the exam.  You may be asked to drink water or other fluids to wash (flush) the contrast material out of your body after your scan. This information is not intended to replace advice given to you by your health care provider. Make sure you discuss any questions you have with your health care provider. Document Released: 11/19/2015 Document Revised: 05/31/2018 Document Reviewed: 11/19/2015 Elsevier Patient Education  2020 [employer].

## 2018-11-19 NOTE — Progress Notes (Signed)
Cardiology Office Note:    Date:  11/19/2018   ID:  Ricardo Morris, DOB 01-01-1951, MRN [id number]  PCP:  Ricardo Erp, MD  Cardiologist:  Ricardo Campus, MD    Referring MD: Ricardo Erp, MD   Chief Complaint  Patient presents with  . Follow-up  Doing well  History of Present Illness:    Ricardo Morris is a 68 y.o. male with history of hypertrophic cardiomyopathy, wall thickness based on echocardiogram from summer of last year 15 mm, non-obstruction.  Overall doing well described to have some exertional shortness of breath but nothing extraordinary.  Denies having any chest pain tightness squeezing pressure burning chest.  Described to have one episode of syncope that happened a year ago when abruptly he passed out.  He does have family history of premature death actually his father in his mid 10s died suddenly.  No autopsy was done.  Past Medical History:  Diagnosis Date  . Anxiety   . Colon polyp   . GERD (gastroesophageal reflux disease)   . Heart abnormality    slight enlarged arota- no change since previous ekg  . History of kidney stones   . Kidney stones    x1  . Sleep apnea    uses c-pap    Past Surgical History:  Procedure Laterality Date  . APPENDECTOMY  1956  . COLONOSCOPY    . EXTRACORPOREAL SHOCK WAVE LITHOTRIPSY Left 01/25/2018   Procedure: EXTRACORPOREAL SHOCK WAVE LITHOTRIPSY (ESWL);  Surgeon: Ardis Hughs, MD;  Location: [facility];  Service: Urology;  Laterality: Left;  . NASAL SINUS SURGERY    . tonsillectomy      Current Medications: Current Meds  Medication Sig  . amitriptyline (ELAVIL) 10 MG tablet Take 10 mg by mouth at bedtime.   Marland Kitchen losartan (COZAAR) 50 MG tablet Take 50 mg by mouth daily.  . naproxen sodium (ALEVE) 220 MG tablet Take 220 mg by mouth daily as needed.  . pantoprazole (PROTONIX) 40 MG tablet Take 40 mg by mouth daily.   . triazolam (HALCION) 0.25 MG tablet Take 0.25 mg by mouth at bedtime and may repeat dose one time if needed.   . valACYclovir (VALTREX) 1000 MG tablet Take 1 tablet by mouth daily.     Allergies:   Penicillins   Social History   Socioeconomic History  . Marital status: Single    Spouse name: Not on file  . Number of children: 1  . Years of education: Not on file  . Highest education level: Not on file  Occupational History  . Occupation: Engineer, production  Social Needs  . Financial resource strain: Not on file  . Food insecurity    Worry: Not on file    Inability: Not on file  . Transportation needs    Medical: Not on file    Non-medical: Not on file  Tobacco Use  . Smoking status: Never Smoker  . Smokeless tobacco: Never Used  Substance and Sexual Activity  . Alcohol use: No  . Drug use: No  . Sexual activity: Not on file  Lifestyle  . Physical activity    Days per week: Not on file    Minutes per session: Not on file  . Stress: Not on file  Relationships  . Social Herbalist on phone: Not on file    Gets together: Not on file    Attends religious service: Not on file    Active member of club or organization:  Not on file    Attends meetings of clubs or organizations: Not on file    Relationship status: Not on file  Other Topics Concern  . Not on file  Social History Narrative  . Not on file     Family History: The patient's family history includes Asthma in his father; Emphysema in his father. There is no history of Pancreatic cancer, Throat cancer, Stomach cancer, Heart disease, Diabetes, Kidney disease, Liver disease, or Colon cancer. ROS:   Please see the history of present illness.    All 14 point review of systems negative except as described per history of present illness  EKGs/Labs/Other Studies Reviewed:      Recent Labs: No results found for requested labs within last 8760 hours.  Recent Lipid Panel No results found for: CHOL, TRIG, HDL, CHOLHDL, VLDL, LDLCALC, LDLDIRECT  Physical Exam:    VS:  BP (!) 150/70   Pulse 73   Ht 6\' 1"  (1.854 m)    Wt 212 lb 12.8 oz (96.5 kg)   SpO2 98%   BMI 28.08 kg/m     Wt Readings from Last 3 Encounters:  11/19/18 212 lb 12.8 oz (96.5 kg)  03/05/18 215 lb (97.5 kg)  01/25/18 208 lb 6 oz (94.5 kg)     GEN:  Well nourished, well developed in no acute distress HEENT: Normal NECK: No JVD; No carotid bruits LYMPHATICS: No lymphadenopathy CARDIAC: RRR, no murmurs, no rubs, no gallops RESPIRATORY:  Clear to auscultation without rales, wheezing or rhonchi  ABDOMEN: Soft, non-tender, non-distended MUSCULOSKELETAL:  No edema; No deformity  SKIN: Warm and dry LOWER EXTREMITIES: no swelling NEUROLOGIC:  Alert and oriented x 3 PSYCHIATRIC:  Normal affect   ASSESSMENT:    1. Hypertrophic cardiomyopathy ([place])   2. Ascending aortic aneurysm ([place])   3. H/O hypertrophic cardiomyopathy   4. Ascending aortic aneurysm ([facility]) 4.6 cm in summer 2019   5. OSA (obstructive sleep apnea)    PLAN:    In order of problems listed above:  1. Hypertrophic cardiomyopathy without obstruction.  Physical examination did not reveal any murmur.  Echocardiogram will be repeated to check on it.  Interestingly EKG done today showed normal sinus rhythm normal P interval no evidence of LVH, nonspecific ST-T segment changes.  I talked to him about hypertrophic cardiomyopathy I recommend his son the only child he has to be checked for it.  He is more than 68 years old need to have EKG as well as echocardiogram done.  It is possible that his father died suddenly because of arrhythmia related to hypertrophic cardiomyopathy.  No autopsy was done and we will never find out really what happened. 2. Ascending aneurysm a CT of his chest will be done to recheck it. 3. Obstructive sleep apnea to be followed by internal medicine team.   Medication Adjustments/Labs and Tests Ordered: Current medicines are reviewed at length with the patient today.  Concerns regarding medicines are outlined above.  Orders Placed This Encounter   Procedures  . CT ANGIO CHEST AORTA W &/OR WO CONTRAST  . Basic Metabolic Panel (BMET)  . LONG TERM MONITOR (3-14 DAYS)  . EKG 12-Lead  . ECHOCARDIOGRAM COMPLETE   Medication changes: No orders of the defined types were placed in this encounter.   Signed, Park Liter, MD, [facility] 11/19/2018 5:11 PM    [place]

## 2018-11-20 LAB — BASIC METABOLIC PANEL
BUN/Creatinine Ratio: 16 (ref 10–24)
BUN: 19 mg/dL (ref 8–27)
CO2: 23 mmol/L (ref 20–29)
Calcium: 9.2 mg/dL (ref 8.6–10.2)
Chloride: 103 mmol/L (ref 96–106)
Creatinine, Ser: 1.22 mg/dL (ref 0.76–1.27)
GFR calc Af Amer: 70 mL/min/{1.73_m2} (ref >59)
GFR calc non Af Amer: 61 mL/min/{1.73_m2} (ref >59)
Glucose: 97 mg/dL (ref 65–99)
Potassium: 4.4 mmol/L (ref 3.5–5.2)
Sodium: 138 mmol/L (ref 134–144)

## 2018-11-21 IMAGING — CR DG ABDOMEN 1V
2 series · 2 of 2 positions shown · non-contrast
Comparison: 01/22/2018

CLINICAL DATA: Preoperative evaluation for left-sided calculus

EXAM:
ABDOMEN - 1 VIEW

[t abdomen supine (1 of 2)]
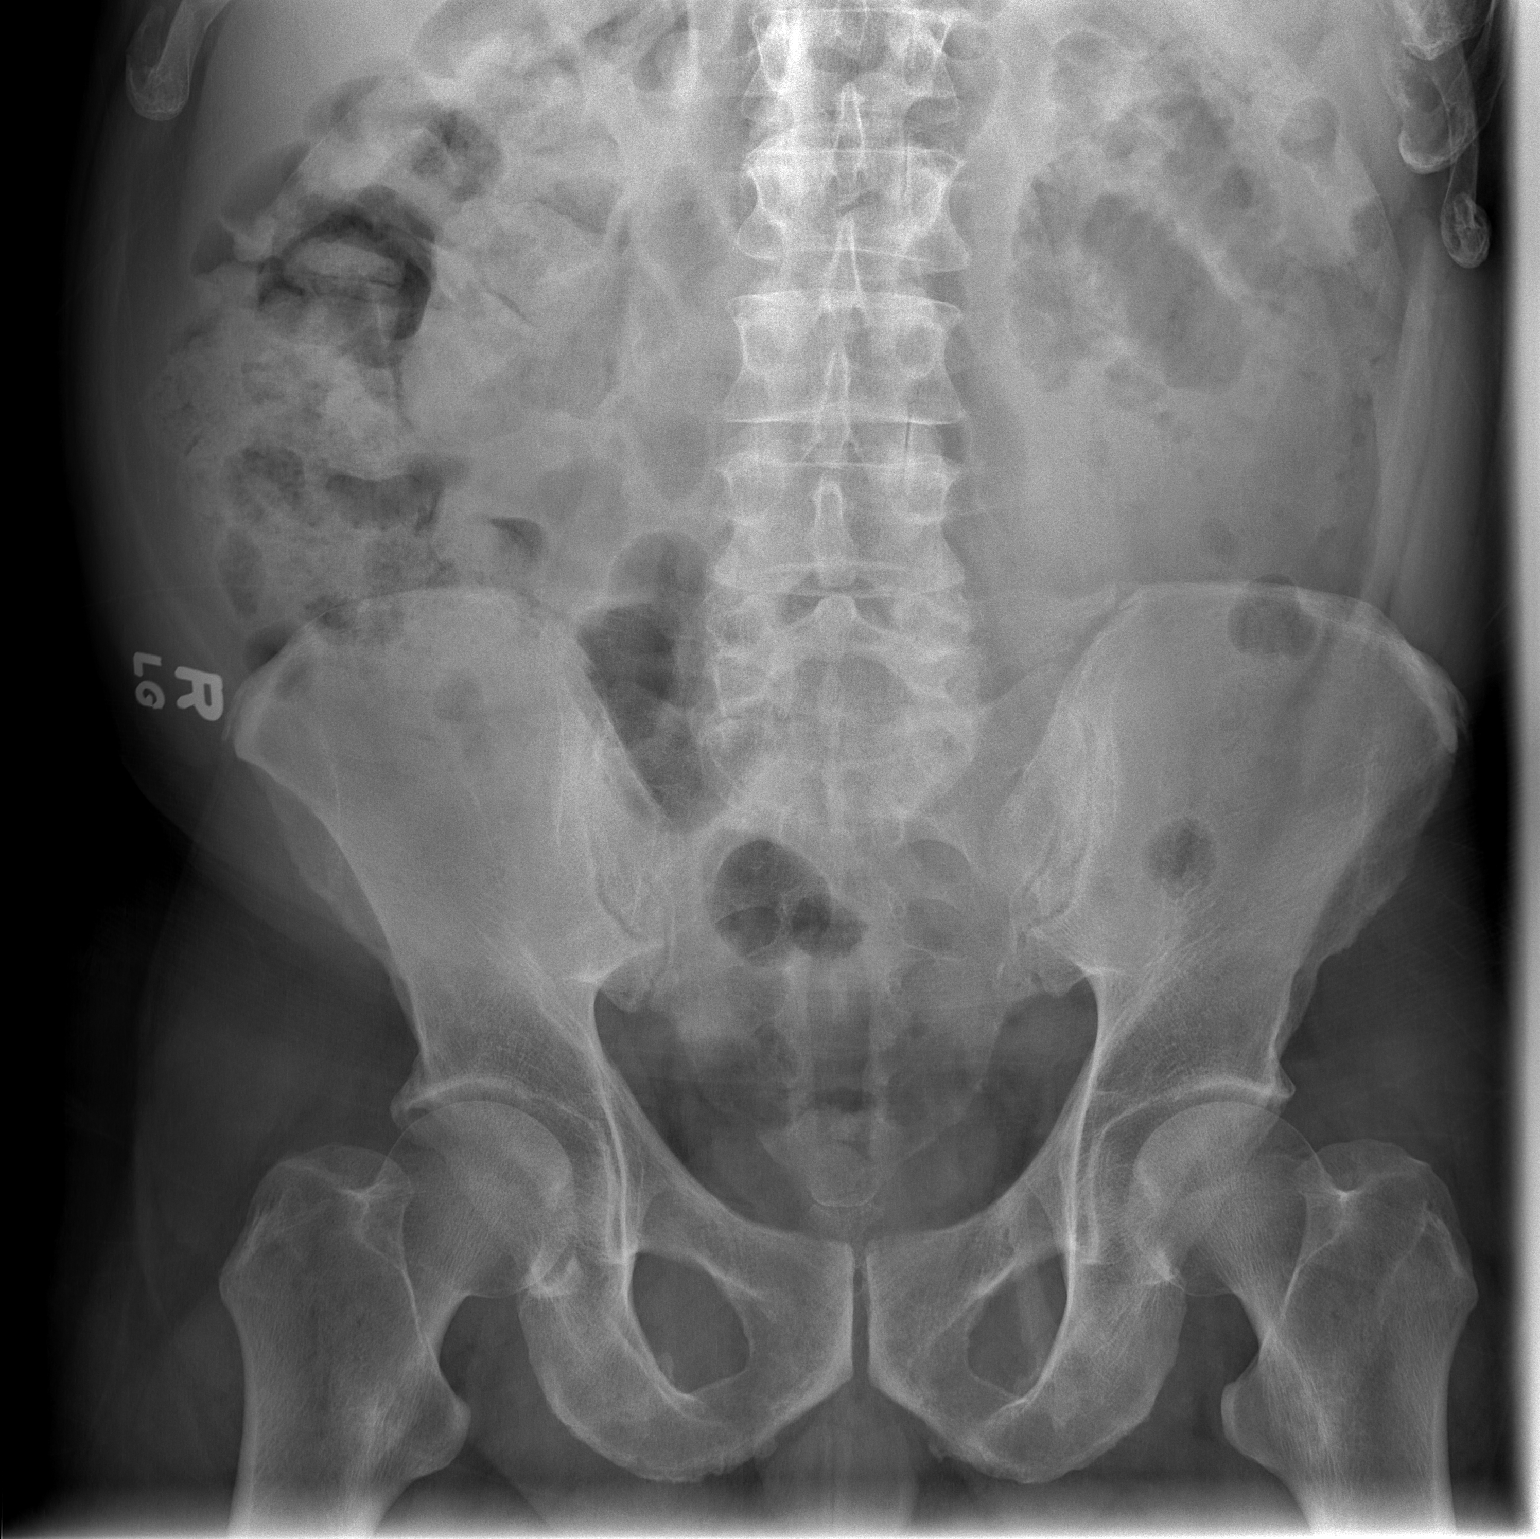

[t abdomen supine (2 of 2)]
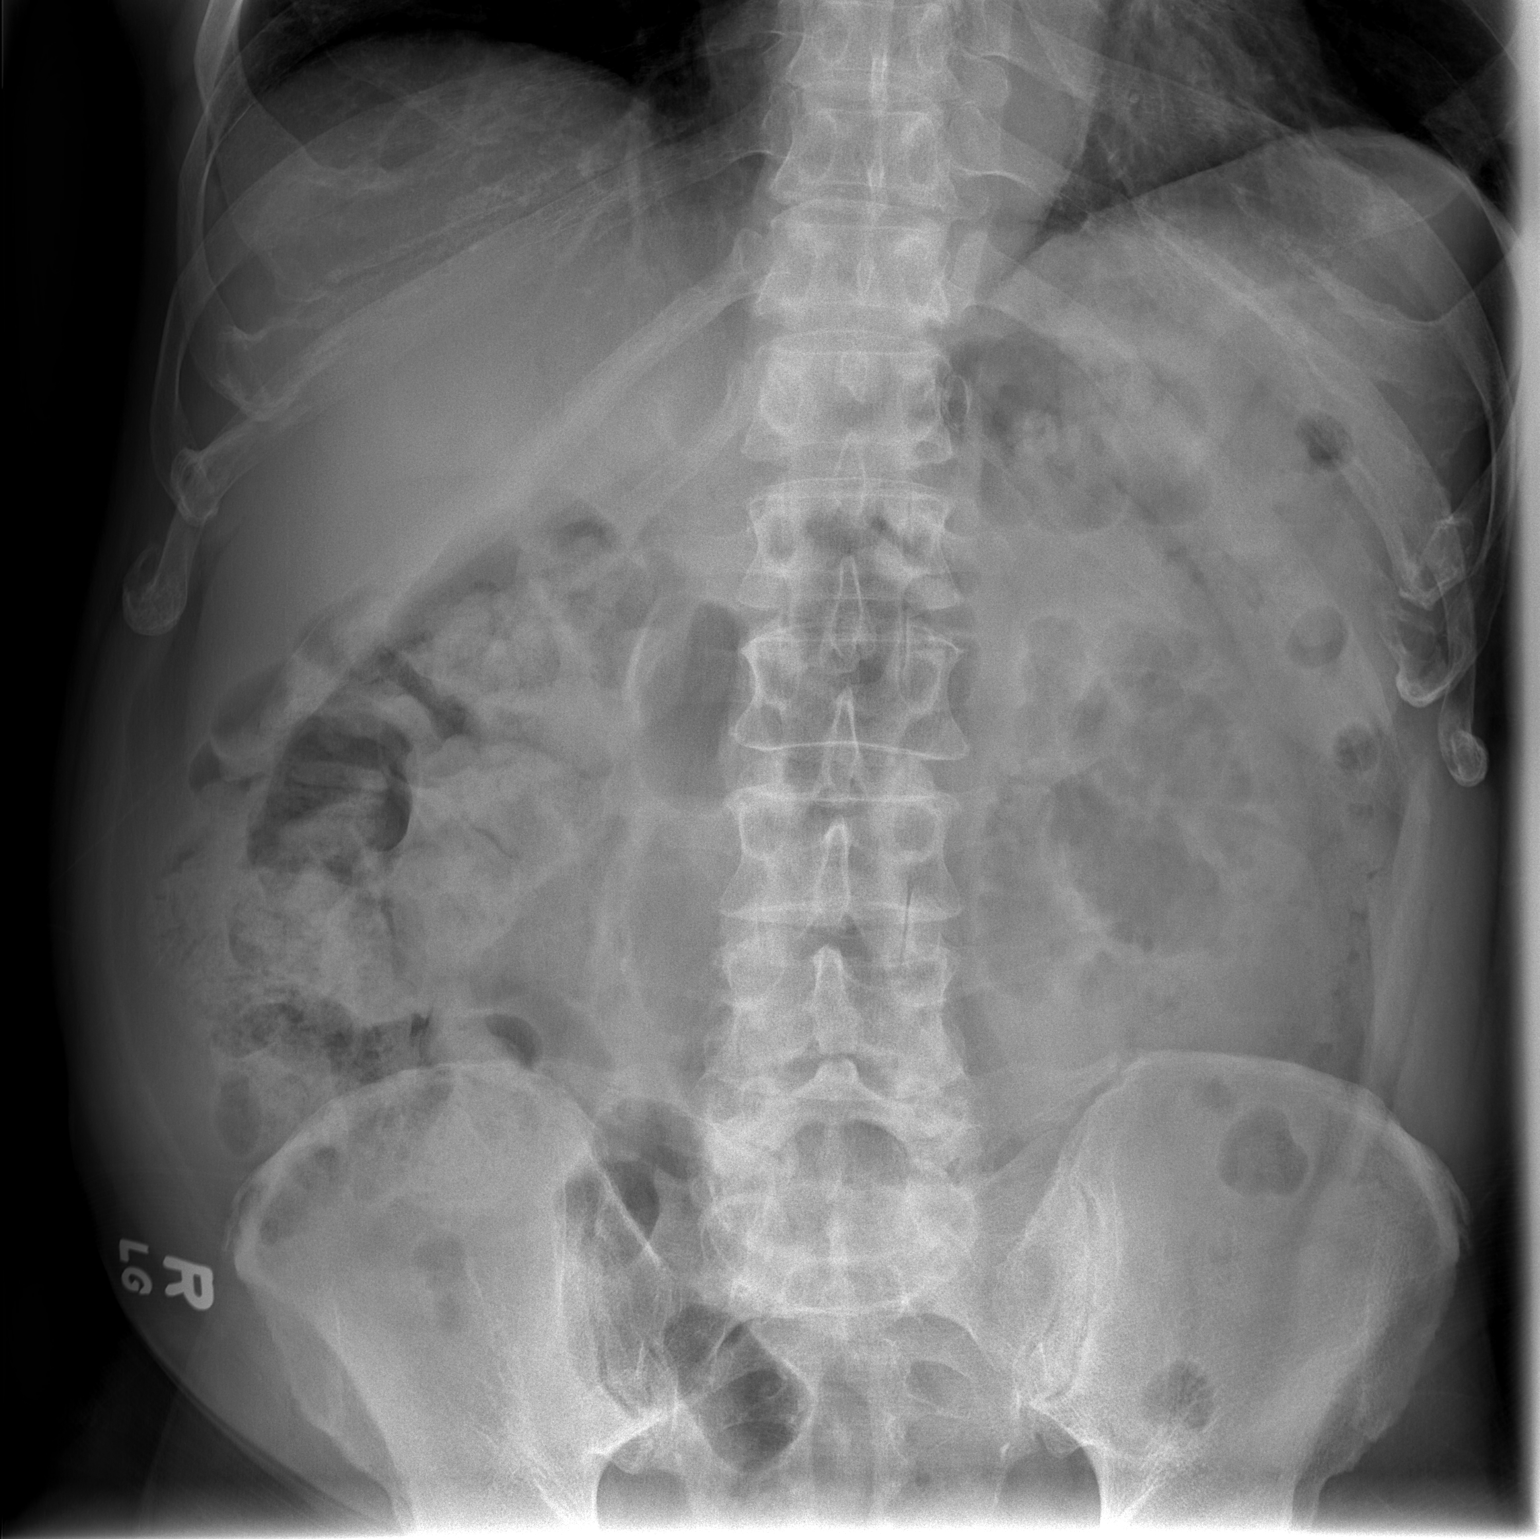

[2 of 2 positions shown; findings below may reference images not displayed]

FINDINGS: Scattered large and small bowel gas is noted. Calcification is noted
over the left half of the sacrum which corresponds to the previously
seen left mid ureteral stone. No acute bony abnormality is noted. No
free air is seen.
IMPRESSION: Left ureteral calculus with slight distal migration when compared
with the previous exam.

## 2018-11-23 ENCOUNTER — Other Ambulatory Visit: Payer: Self-pay

## 2018-11-23 ENCOUNTER — Ambulatory Visit (HOSPITAL_COMMUNITY): Payer: PPO | Attending: Cardiovascular Disease

## 2018-11-23 DIAGNOSIS — I422 Other hypertrophic cardiomyopathy: Secondary | ICD-10-CM | POA: Insufficient documentation

## 2018-11-26 ENCOUNTER — Telehealth: Payer: Self-pay | Admitting: Cardiology

## 2018-11-26 ENCOUNTER — Other Ambulatory Visit (INDEPENDENT_AMBULATORY_CARE_PROVIDER_SITE_OTHER): Payer: PPO

## 2018-11-26 DIAGNOSIS — I422 Other hypertrophic cardiomyopathy: Secondary | ICD-10-CM

## 2018-11-26 NOTE — Telephone Encounter (Signed)
Patient is having a CT scan tomorrow and will put on ZIO monitor after CT. He is asking if he can take Cialis and have sex while having the monitor on and if it will affect anything. Please advise.

## 2018-11-26 NOTE — Telephone Encounter (Signed)
Called patient and informed him he can take cialis and have sex while he wears the monitor. Patient verbally understands. No further questions.

## 2018-11-27 ENCOUNTER — Encounter (HOSPITAL_BASED_OUTPATIENT_CLINIC_OR_DEPARTMENT_OTHER): Payer: Self-pay

## 2018-11-27 ENCOUNTER — Other Ambulatory Visit: Payer: Self-pay

## 2018-11-27 ENCOUNTER — Ambulatory Visit (HOSPITAL_BASED_OUTPATIENT_CLINIC_OR_DEPARTMENT_OTHER)
Admission: RE | Admit: 2018-11-27 | Discharge: 2018-11-27 | Disposition: A | Discharge status: Home / Self Care | Payer: PPO | Source: Ambulatory Visit | Attending: Cardiology | Admitting: Cardiology

## 2018-11-27 DIAGNOSIS — I700 Atherosclerosis of aorta: Secondary | ICD-10-CM | POA: Diagnosis not present

## 2018-11-27 DIAGNOSIS — I712 Thoracic aortic aneurysm, without rupture: Secondary | ICD-10-CM | POA: Diagnosis not present

## 2018-11-27 MED ORDER — IOHEXOL 350 MG/ML SOLN
100.0000 mL | Freq: Once | INTRAVENOUS | Status: AC | PRN
  Administered 2018-11-27: 100 mL via INTRAVENOUS

## 2018-11-29 ENCOUNTER — Telehealth: Payer: Self-pay | Admitting: *Deleted

## 2018-11-29 MED ORDER — CARVEDILOL 3.125 MG PO TABS: 3.1250 mg | ORAL_TABLET | Freq: Two times a day (BID) | ORAL | 2 refills | Status: DC

## 2018-11-29 NOTE — Telephone Encounter (Signed)
Telephone call to patient.Informed of echo results and need to start Coreg 3.125 mg twice daily.Patient verbalized understanding

## 2018-11-29 NOTE — Telephone Encounter (Signed)
-----   Message from Park Liter, MD sent at 11/28/2018  7:11 PM EDT ----- Ef 40-45%  Add coreg 3.125 bid

## 2018-12-04 ENCOUNTER — Telehealth: Payer: Self-pay | Admitting: Cardiology

## 2018-12-04 NOTE — Telephone Encounter (Signed)
Spoke with patient, he states that he has noticed that his blood pressure has become elevated. He states that it may be due to him worrying about his heart. He states that he will take some readings for the next few days and talk to his PCP and send a faxed copy to the [facility] office. At the same time he had some concern about when and if he will need to have a follow up on his testing. I have advised that he will need to have a follow up to discuss his results, but we will need to get his holter back to see if it needs to be earlier than 6 months. Patient expressed understanding.

## 2018-12-04 NOTE — Telephone Encounter (Signed)
Patient is asking for a call back regarding blood pressure questions and whether he will need an appt after his testing is complete. He is in recall for f/up

## 2018-12-11 ENCOUNTER — Other Ambulatory Visit: Payer: Self-pay | Admitting: *Deleted

## 2018-12-11 DIAGNOSIS — Z20822 Contact with and (suspected) exposure to covid-19: Secondary | ICD-10-CM

## 2018-12-11 DIAGNOSIS — R6889 Other general symptoms and signs: Secondary | ICD-10-CM | POA: Diagnosis not present

## 2018-12-13 LAB — NOVEL CORONAVIRUS, NAA: SARS-CoV-2, NAA: NOT DETECTED

## 2018-12-18 ENCOUNTER — Other Ambulatory Visit: Payer: Self-pay | Admitting: Registered"

## 2018-12-18 DIAGNOSIS — I429 Cardiomyopathy, unspecified: Secondary | ICD-10-CM | POA: Diagnosis not present

## 2018-12-18 DIAGNOSIS — Z20822 Contact with and (suspected) exposure to covid-19: Secondary | ICD-10-CM

## 2018-12-18 DIAGNOSIS — R6889 Other general symptoms and signs: Secondary | ICD-10-CM | POA: Diagnosis not present

## 2018-12-20 LAB — NOVEL CORONAVIRUS, NAA: SARS-CoV-2, NAA: NOT DETECTED

## 2019-01-14 ENCOUNTER — Other Ambulatory Visit: Payer: Self-pay

## 2019-01-14 DIAGNOSIS — Z20828 Contact with and (suspected) exposure to other viral communicable diseases: Secondary | ICD-10-CM | POA: Diagnosis not present

## 2019-01-14 DIAGNOSIS — Z20822 Contact with and (suspected) exposure to covid-19: Secondary | ICD-10-CM

## 2019-01-15 LAB — NOVEL CORONAVIRUS, NAA: SARS-CoV-2, NAA: NOT DETECTED

## 2019-01-22 DIAGNOSIS — H30031 Focal chorioretinal inflammation, peripheral, right eye: Secondary | ICD-10-CM | POA: Diagnosis not present

## 2019-01-22 DIAGNOSIS — H21541 Posterior synechiae (iris), right eye: Secondary | ICD-10-CM | POA: Diagnosis not present

## 2019-01-22 DIAGNOSIS — H3581 Retinal edema: Secondary | ICD-10-CM | POA: Diagnosis not present

## 2019-01-22 DIAGNOSIS — H2513 Age-related nuclear cataract, bilateral: Secondary | ICD-10-CM | POA: Diagnosis not present

## 2019-01-22 DIAGNOSIS — H35033 Hypertensive retinopathy, bilateral: Secondary | ICD-10-CM | POA: Diagnosis not present

## 2019-01-23 NOTE — Telephone Encounter (Signed)
Please call patient about results

## 2019-01-25 NOTE — Telephone Encounter (Signed)
Returned phone call to patient and discussed the previous results of his tests. Gave reassurance and that he didn't need to follow up sooner than originally stated at 6 months.

## 2019-02-06 DIAGNOSIS — G4733 Obstructive sleep apnea (adult) (pediatric): Secondary | ICD-10-CM | POA: Diagnosis not present

## 2019-02-06 DIAGNOSIS — N200 Calculus of kidney: Secondary | ICD-10-CM | POA: Diagnosis not present

## 2019-02-06 DIAGNOSIS — K635 Polyp of colon: Secondary | ICD-10-CM | POA: Diagnosis not present

## 2019-02-06 DIAGNOSIS — Z1211 Encounter for screening for malignant neoplasm of colon: Secondary | ICD-10-CM | POA: Diagnosis not present

## 2019-02-06 DIAGNOSIS — I351 Nonrheumatic aortic (valve) insufficiency: Secondary | ICD-10-CM | POA: Diagnosis not present

## 2019-02-06 DIAGNOSIS — Z23 Encounter for immunization: Secondary | ICD-10-CM | POA: Diagnosis not present

## 2019-02-06 DIAGNOSIS — I10 Essential (primary) hypertension: Secondary | ICD-10-CM | POA: Diagnosis not present

## 2019-02-06 DIAGNOSIS — Z125 Encounter for screening for malignant neoplasm of prostate: Secondary | ICD-10-CM | POA: Diagnosis not present

## 2019-02-22 ENCOUNTER — Other Ambulatory Visit: Payer: Self-pay

## 2019-02-22 DIAGNOSIS — Z20822 Contact with and (suspected) exposure to covid-19: Secondary | ICD-10-CM

## 2019-02-25 LAB — NOVEL CORONAVIRUS, NAA: SARS-CoV-2, NAA: NOT DETECTED

## 2019-03-06 ENCOUNTER — Telehealth: Payer: Self-pay | Admitting: Adult Health

## 2019-03-06 ENCOUNTER — Ambulatory Visit (INDEPENDENT_AMBULATORY_CARE_PROVIDER_SITE_OTHER): Payer: PPO | Admitting: Adult Health

## 2019-03-06 ENCOUNTER — Other Ambulatory Visit: Payer: Self-pay

## 2019-03-06 ENCOUNTER — Encounter: Payer: Self-pay | Admitting: Adult Health

## 2019-03-06 DIAGNOSIS — G4733 Obstructive sleep apnea (adult) (pediatric): Secondary | ICD-10-CM

## 2019-03-06 NOTE — Progress Notes (Signed)
Virtual Visit via Telephone Note  I connected with Ricardo Morris on 03/06/19 at  9:30 AM EST by telephone and verified that I am speaking with the correct person using two identifiers.  Location: Patient: Home  Provider: Office    I discussed the limitations, risks, security and privacy concerns of performing an evaluation and management service by telephone and the availability of in person appointments. I also discussed with the patient that there may be a patient responsible charge related to this service. The patient expressed understanding and agreed to proceed.   History of Present Illness: 68 year old male followed for obstructive sleep apnea Medical history significant for previous UPPP  Today's televisit is a 1 year follow-up for obstructive sleep apnea.  Patient has underlying obstructive sleep apnea is on nocturnal CPAP.  Patient says he is doing very well on CPAP but has been traveling a lot and has not been taking with him as much .  Feels rested for the most part.  Says he wears it about 5 to 6 hours. Has had some dry skin around face with mask but does not want to chang.   Patient remains on CPAP 7 cm H2O. Download shows usage 8/30 days, average 4hrs 33mins AHI 1.3.  Encouraged on CPAP compliance.  Patient education on CPAP.      Observations/Objective: PSG 2006 showed RDI of 13 events per hour corrected by CPAP of 7 cm with a full face mask and lowest desaturation of 89%  Assessment and Plan: Obstructive sleep apnea- encouraged on complance.  Patient had to end call due to appointment [facility] . Went over instructions and will send electronically and via mail.   Plan  Patient Instructions  Continue on CPap at bedtime Try to wear CPAP each night for at least 4-6 hr .  Do not drive if sleepy Work on healthy weight .  Follow-up with Dr. Elsworth Soho  In 1 year and As needed       Follow Up Instructions:   Follow-up in 1 year and as needed  I discussed the  assessment and treatment plan with the patient. The patient was provided an opportunity to ask questions and all were answered. The patient agreed with the plan and demonstrated an understanding of the instructions.   The patient was advised to call back or seek an in-person evaluation if the symptoms worsen or if the condition fails to improve as anticipated.  I provided 14  minutes of non-face-to-face time during this encounter.   Rexene Edison, NP

## 2019-03-06 NOTE — Telephone Encounter (Signed)
LM informing pt TP has been made aware and if he has any further questions or concerns to give [place] a call back.   Nothing further needed at this time.

## 2019-03-06 NOTE — Telephone Encounter (Signed)
Noted. Will route message to TP.

## 2019-03-06 NOTE — Patient Instructions (Signed)
Continue on CPap at bedtime Try to wear CPAP each night for at least 4-6 hr .  Do not drive if sleepy Work on healthy weight .  Follow-up with Dr. Elsworth Soho  In 1 year and As needed

## 2019-03-06 NOTE — Telephone Encounter (Signed)
No problem at all, does he have any other questions or need something else. I am glad to call him back .

## 2019-03-08 NOTE — Addendum Note (Signed)
Addended by: Parke Poisson E on: 03/08/2019 10:16 AM   Modules accepted: Orders

## 2019-03-27 ENCOUNTER — Ambulatory Visit: Payer: PPO | Attending: Internal Medicine

## 2019-03-27 DIAGNOSIS — G4733 Obstructive sleep apnea (adult) (pediatric): Secondary | ICD-10-CM | POA: Diagnosis not present

## 2019-03-27 DIAGNOSIS — Z20822 Contact with and (suspected) exposure to covid-19: Secondary | ICD-10-CM

## 2019-03-27 DIAGNOSIS — Z20828 Contact with and (suspected) exposure to other viral communicable diseases: Secondary | ICD-10-CM | POA: Diagnosis not present

## 2019-03-28 LAB — NOVEL CORONAVIRUS, NAA: SARS-CoV-2, NAA: NOT DETECTED

## 2019-04-18 ENCOUNTER — Ambulatory Visit: Payer: PPO | Attending: Internal Medicine

## 2019-04-18 DIAGNOSIS — I712 Thoracic aortic aneurysm, without rupture: Secondary | ICD-10-CM | POA: Diagnosis not present

## 2019-04-18 DIAGNOSIS — G4700 Insomnia, unspecified: Secondary | ICD-10-CM | POA: Diagnosis not present

## 2019-04-18 DIAGNOSIS — Z1331 Encounter for screening for depression: Secondary | ICD-10-CM | POA: Diagnosis not present

## 2019-04-18 DIAGNOSIS — Z1389 Encounter for screening for other disorder: Secondary | ICD-10-CM | POA: Insufficient documentation

## 2019-04-18 DIAGNOSIS — R05 Cough: Secondary | ICD-10-CM | POA: Diagnosis not present

## 2019-04-18 DIAGNOSIS — Z87442 Personal history of urinary calculi: Secondary | ICD-10-CM | POA: Diagnosis not present

## 2019-04-18 DIAGNOSIS — H579 Unspecified disorder of eye and adnexa: Secondary | ICD-10-CM | POA: Insufficient documentation

## 2019-04-18 DIAGNOSIS — G4733 Obstructive sleep apnea (adult) (pediatric): Secondary | ICD-10-CM | POA: Diagnosis not present

## 2019-04-18 DIAGNOSIS — E663 Overweight: Secondary | ICD-10-CM | POA: Insufficient documentation

## 2019-04-18 DIAGNOSIS — Z20822 Contact with and (suspected) exposure to covid-19: Secondary | ICD-10-CM

## 2019-04-18 DIAGNOSIS — N529 Male erectile dysfunction, unspecified: Secondary | ICD-10-CM | POA: Insufficient documentation

## 2019-04-18 DIAGNOSIS — R059 Cough, unspecified: Secondary | ICD-10-CM | POA: Insufficient documentation

## 2019-04-18 DIAGNOSIS — I10 Essential (primary) hypertension: Secondary | ICD-10-CM | POA: Insufficient documentation

## 2019-04-18 DIAGNOSIS — K219 Gastro-esophageal reflux disease without esophagitis: Secondary | ICD-10-CM | POA: Insufficient documentation

## 2019-04-19 LAB — NOVEL CORONAVIRUS, NAA: SARS-CoV-2, NAA: NOT DETECTED

## 2019-04-30 ENCOUNTER — Ambulatory Visit: Payer: PPO

## 2019-05-01 DIAGNOSIS — R05 Cough: Secondary | ICD-10-CM | POA: Diagnosis not present

## 2019-05-10 ENCOUNTER — Ambulatory Visit: Payer: PPO | Attending: Internal Medicine

## 2019-05-10 DIAGNOSIS — Z23 Encounter for immunization: Secondary | ICD-10-CM

## 2019-05-10 NOTE — Progress Notes (Signed)
   Covid-19 Vaccination Clinic  Name:  MAARTEN BAISCH    MRN: [id number] DOB: 06-19-1950  05/10/2019  Mr. Pho was observed post Covid-19 immunization for 15 minutes without incidence. He was provided with Vaccine Information Sheet and instruction to access the V-Safe system.   Mr. Saline was instructed to call 911 with any severe reactions post vaccine: Marland Kitchen Difficulty breathing  . Swelling of your face and throat  . A fast heartbeat  . A bad rash all over your body  . Dizziness and weakness    Immunizations Administered    Name Date Dose VIS Date Route   Pfizer COVID-19 Vaccine 05/10/2019  1:08 PM 0.3 mL 03/15/2019 Intramuscular   Manufacturer: [place]   Lot: [id number]   [place]: [id number]

## 2019-05-13 DIAGNOSIS — H2513 Age-related nuclear cataract, bilateral: Secondary | ICD-10-CM | POA: Diagnosis not present

## 2019-05-13 DIAGNOSIS — H20011 Primary iridocyclitis, right eye: Secondary | ICD-10-CM | POA: Diagnosis not present

## 2019-05-13 DIAGNOSIS — H40013 Open angle with borderline findings, low risk, bilateral: Secondary | ICD-10-CM | POA: Diagnosis not present

## 2019-05-23 ENCOUNTER — Ambulatory Visit: Payer: PPO | Admitting: Cardiology

## 2019-05-27 ENCOUNTER — Other Ambulatory Visit: Payer: Self-pay

## 2019-05-27 ENCOUNTER — Ambulatory Visit: Payer: PPO | Admitting: Cardiology

## 2019-05-27 ENCOUNTER — Encounter: Payer: Self-pay | Admitting: Cardiology

## 2019-05-27 VITALS — BP 128/80 | HR 81 | Ht 73.0 in | Wt 208.1 lb

## 2019-05-27 DIAGNOSIS — I712 Thoracic aortic aneurysm, without rupture: Secondary | ICD-10-CM

## 2019-05-27 DIAGNOSIS — G4733 Obstructive sleep apnea (adult) (pediatric): Secondary | ICD-10-CM

## 2019-05-27 DIAGNOSIS — I10 Essential (primary) hypertension: Secondary | ICD-10-CM

## 2019-05-27 DIAGNOSIS — I7121 Aneurysm of the ascending aorta, without rupture: Secondary | ICD-10-CM

## 2019-05-27 DIAGNOSIS — Z8679 Personal history of other diseases of the circulatory system: Secondary | ICD-10-CM

## 2019-05-27 HISTORY — DX: Essential (primary) hypertension: I10

## 2019-05-27 MED ORDER — CARVEDILOL 6.25 MG PO TABS: 6.2500 mg | ORAL_TABLET | Freq: Two times a day (BID) | ORAL | 2 refills | Status: DC

## 2019-05-27 NOTE — Addendum Note (Signed)
Addended by: Ashok Norris on: 05/27/2019 03:24 PM   Modules accepted: Orders

## 2019-05-27 NOTE — Progress Notes (Signed)
Cardiology Office Note:    Date:  05/27/2019   ID:  Ricardo Morris, DOB [birth year], MRN [id number]  PCP:  Sueanne Margarita, DO  Cardiologist:  Jenne Campus, MD    Referring MD: Levin Erp, MD   No chief complaint on file. Doing well  History of Present Illness:    Ricardo Morris is a 69 y.o. male with past medical history significant for hypertrophic cardiomyopathy about wall thickness only 50 mm, no obstruction, essential hypertension, dyslipidemia.  Comes today to my for follow-up.  Overall doing great.  Try to exercise on a regular basis walking for half an hour to 45 minutes.  Denies having any chest pain, tightness, pressure, burning in chest, no syncope, no palpitations.  Overall doing well  Past Medical History:  Diagnosis Date  . Anxiety   . Colon polyp   . GERD (gastroesophageal reflux disease)   . Heart abnormality    slight enlarged arota- no change since previous ekg  . History of kidney stones   . Kidney stones    x1  . Sleep apnea    uses c-pap    Past Surgical History:  Procedure Laterality Date  . APPENDECTOMY  1956  . COLONOSCOPY    . EXTRACORPOREAL SHOCK WAVE LITHOTRIPSY Left 01/25/2018   Procedure: EXTRACORPOREAL SHOCK WAVE LITHOTRIPSY (ESWL);  Surgeon: Ardis Hughs, MD;  Location: [facility];  Service: Urology;  Laterality: Left;  . NASAL SINUS SURGERY    . tonsillectomy      Current Medications: Current Meds  Medication Sig  . amitriptyline (ELAVIL) 10 MG tablet Take 10 mg by mouth at bedtime.   Marland Kitchen amLODipine (NORVASC) 5 MG tablet Take 5 mg by mouth daily.  . carvedilol (COREG) 3.125 MG tablet Take 1 tablet (3.125 mg total) by mouth 2 (two) times daily.  Marland Kitchen losartan (COZAAR) 50 MG tablet Take 50 mg by mouth daily.  . naproxen sodium (ALEVE) 220 MG tablet Take 220 mg by mouth daily as needed.  . pantoprazole (PROTONIX) 40 MG tablet Take 40 mg by mouth daily.   . triazolam (HALCION) 0.25 MG tablet Take 0.25 mg by mouth at bedtime and may repeat  dose one time if needed.  . valACYclovir (VALTREX) 1000 MG tablet Take 1 tablet by mouth daily.     Allergies:   Penicillins   Social History   Socioeconomic History  . Marital status: Single    Spouse name: Not on file  . Number of children: 1  . Years of education: Not on file  . Highest education level: Not on file  Occupational History  . Occupation: Engineer, production  Tobacco Use  . Smoking status: Never Smoker  . Smokeless tobacco: Never Used  Substance and Sexual Activity  . Alcohol use: No  . Drug use: No  . Sexual activity: Not on file  Other Topics Concern  . Not on file  Social History Narrative  . Not on file   Social Determinants of Health   Financial Resource Strain:   . Difficulty of Paying Living Expenses: Not on file  Food Insecurity:   . Worried About Charity fundraiser in the Last Year: Not on file  . Ran Out of Food in the Last Year: Not on file  Transportation Needs:   . Lack of Transportation (Medical): Not on file  . Lack of Transportation (Non-Medical): Not on file  Physical Activity:   . Days of Exercise per Week: Not on file  . Minutes  of Exercise per Session: Not on file  Stress:   . Feeling of Stress : Not on file  Social Connections:   . Frequency of Communication with Friends and Family: Not on file  . Frequency of Social Gatherings with Friends and Family: Not on file  . Attends Religious Services: Not on file  . Active Member of Clubs or Organizations: Not on file  . Attends Archivist Meetings: Not on file  . Marital Status: Not on file     Family History: The patient's family history includes Asthma in his father; Emphysema in his father. There is no history of Pancreatic cancer, Throat cancer, Stomach cancer, Heart disease, Diabetes, Kidney disease, Liver disease, or Colon cancer. ROS:   Please see the history of present illness.    All 14 point review of systems negative except as described per history of present  illness  EKGs/Labs/Other Studies Reviewed:      Recent Labs: 11/19/2018: BUN 19; Creatinine, Ser 1.22; Potassium 4.4; Sodium 138  Recent Lipid Panel No results found for: CHOL, TRIG, HDL, CHOLHDL, VLDL, LDLCALC, LDLDIRECT  Physical Exam:    VS:  BP 128/80   Pulse 81   Ht 6\' 1"  (1.854 m)   Wt 208 lb 1.9 oz (94.4 kg)   SpO2 96%   BMI 27.46 kg/m     Wt Readings from Last 3 Encounters:  05/27/19 208 lb 1.9 oz (94.4 kg)  11/19/18 212 lb 12.8 oz (96.5 kg)  03/05/18 215 lb (97.5 kg)     GEN:  Well nourished, well developed in no acute distress HEENT: Normal NECK: No JVD; No carotid bruits LYMPHATICS: No lymphadenopathy CARDIAC: RRR, no murmurs, no rubs, no gallops RESPIRATORY:  Clear to auscultation without rales, wheezing or rhonchi  ABDOMEN: Soft, non-tender, non-distended MUSCULOSKELETAL:  No edema; No deformity  SKIN: Warm and dry LOWER EXTREMITIES: no swelling NEUROLOGIC:  Alert and oriented x 3 PSYCHIATRIC:  Normal affect   ASSESSMENT:    1. Ascending aortic aneurysm (HCC) 4.6 cm in summer 2019   2. H/O hypertrophic cardiomyopathy   3. OSA (obstructive sleep apnea)   4. Essential hypertension    PLAN:    In order of problems listed above:  1. Ascending octagon of the sternum the same diameter 2024.6 cm.  We will continue monitoring. 2. History of hypertrophic cardiomyopathy.  Last echocardiogram showed wall thickness 1.5 cm.  We will continue monitoring will control his blood pressure better 3. Obstructive sleep apnea followed by 10 medicine team. 4. Essential hypertension we will do EKG today if EKG is fine will increase the dose of carvedilol.   When I see him we will schedule him to have an echocardiogram.   Medication Adjustments/Labs and Tests Ordered: Current medicines are reviewed at length with the patient today.  Concerns regarding medicines are outlined above.  No orders of the defined types were placed in this encounter.  Medication changes: No  orders of the defined types were placed in this encounter.   Signed, Park Liter, MD, [facility] 05/27/2019 2:36 PM    [place]

## 2019-05-27 NOTE — Patient Instructions (Signed)
Medication Instructions:  Your physician has recommended you make the following change in your medication:    INCREASE: CARVEDILOL to 6.25 mg twice daily *If you need a refill on your cardiac medications before your next appointment, please call your pharmacy*  Lab Work: None.  If you have labs (blood work) drawn today and your tests are completely normal, you will receive your results only by: Marland Kitchen MyChart Message (if you have MyChart) OR . A paper copy in the mail If you have any lab test that is abnormal or we need to change your treatment, we will call you to review the results.  Testing/Procedures: None.   Follow-Up: At [facility], you and your health needs are our priority.  As part of our continuing mission to provide you with exceptional heart care, we have created designated Provider Care Teams.  These Care Teams include your primary Cardiologist (physician) and Advanced Practice Providers (APPs -  Physician Assistants and Nurse Practitioners) who all work together to provide you with the care you need, when you need it.  Your next appointment:   6 month(s)  The format for your next appointment:   In Person  Provider:   You may see Dr. Agustin Cree or the following Advanced Practice Provider on your designated Care Team:    Laurann Montana, FNP   Other Instructions

## 2019-06-04 ENCOUNTER — Ambulatory Visit: Payer: PPO | Attending: Internal Medicine

## 2019-06-04 DIAGNOSIS — Z23 Encounter for immunization: Secondary | ICD-10-CM

## 2019-06-04 NOTE — Progress Notes (Signed)
   Covid-19 Vaccination Clinic  Name:  Ricardo Morris    MRN: [id number] DOB: 02/13/1951  06/04/2019  Ricardo Morris was observed post Covid-19 immunization for 15 minutes without incident. He was provided with Vaccine Information Sheet and instruction to access the V-Safe system.   Ricardo Morris was instructed to call 911 with any severe reactions post vaccine: Marland Kitchen Difficulty breathing  . Swelling of face and throat  . A fast heartbeat  . A bad rash all over body  . Dizziness and weakness   Immunizations Administered    Name Date Dose VIS Date Route   Pfizer COVID-19 Vaccine 06/04/2019 11:09 AM 0.3 mL 03/15/2019 Intramuscular   Manufacturer: [place]   Lot: [id number]   [place]: [id number]

## 2019-06-25 DIAGNOSIS — G4733 Obstructive sleep apnea (adult) (pediatric): Secondary | ICD-10-CM | POA: Diagnosis not present

## 2019-07-01 DIAGNOSIS — G4733 Obstructive sleep apnea (adult) (pediatric): Secondary | ICD-10-CM | POA: Diagnosis not present

## 2019-07-12 ENCOUNTER — Encounter (HOSPITAL_COMMUNITY): Payer: Self-pay

## 2019-07-12 ENCOUNTER — Emergency Department (HOSPITAL_COMMUNITY)
Admission: EM | Admit: 2019-07-12 | Discharge: 2019-07-12 | Disposition: A | Discharge status: Home / Self Care | Payer: PPO | Attending: Emergency Medicine | Admitting: Emergency Medicine

## 2019-07-12 DIAGNOSIS — I10 Essential (primary) hypertension: Secondary | ICD-10-CM | POA: Insufficient documentation

## 2019-07-12 DIAGNOSIS — R55 Syncope and collapse: Secondary | ICD-10-CM

## 2019-07-12 DIAGNOSIS — W19XXXA Unspecified fall, initial encounter: Secondary | ICD-10-CM | POA: Diagnosis not present

## 2019-07-12 DIAGNOSIS — R61 Generalized hyperhidrosis: Secondary | ICD-10-CM | POA: Diagnosis not present

## 2019-07-12 DIAGNOSIS — Z79899 Other long term (current) drug therapy: Secondary | ICD-10-CM | POA: Insufficient documentation

## 2019-07-12 DIAGNOSIS — R110 Nausea: Secondary | ICD-10-CM | POA: Diagnosis not present

## 2019-07-12 DIAGNOSIS — R404 Transient alteration of awareness: Secondary | ICD-10-CM | POA: Diagnosis not present

## 2019-07-12 LAB — CBC WITH DIFFERENTIAL/PLATELET
Abs Immature Granulocytes: 0.04 10*3/uL (ref 0.00–0.07)
Basophils Absolute: 0 10*3/uL (ref 0.0–0.1)
Basophils Relative: 0 %
Eosinophils Absolute: 0.1 10*3/uL (ref 0.0–0.5)
Eosinophils Relative: 1 %
HCT: 39 % (ref 39.0–52.0)
Hemoglobin: 13.5 g/dL (ref 13.0–17.0)
Immature Granulocytes: 0 %
Lymphocytes Relative: 23 %
Lymphs Abs: 2.2 10*3/uL (ref 0.7–4.0)
MCH: 31.4 pg (ref 26.0–34.0)
MCHC: 34.6 g/dL (ref 30.0–36.0)
MCV: 90.7 fL (ref 80.0–100.0)
Monocytes Absolute: 0.8 10*3/uL (ref 0.1–1.0)
Monocytes Relative: 8 %
Neutro Abs: 6.6 10*3/uL (ref 1.7–7.7)
Neutrophils Relative %: 68 %
Platelets: 219 10*3/uL (ref 150–400)
RBC: 4.3 MIL/uL (ref 4.22–5.81)
RDW: 13.1 % (ref 11.5–15.5)
WBC: 9.7 10*3/uL (ref 4.0–10.5)
nRBC: 0 % (ref 0.0–0.2)

## 2019-07-12 LAB — URINALYSIS, ROUTINE W REFLEX MICROSCOPIC
Bacteria, UA: NONE SEEN
Bilirubin Urine: NEGATIVE
Glucose, UA: NEGATIVE mg/dL
Hgb urine dipstick: NEGATIVE
Ketones, ur: NEGATIVE mg/dL
Leukocytes,Ua: NEGATIVE
Nitrite: NEGATIVE
Protein, ur: 30 mg/dL — AB
Specific Gravity, Urine: 1.016 (ref 1.005–1.030)
pH: 6 (ref 5.0–8.0)

## 2019-07-12 LAB — COMPREHENSIVE METABOLIC PANEL
ALT: 19 U/L (ref 0–44)
AST: 18 U/L (ref 15–41)
Albumin: 3.6 g/dL (ref 3.5–5.0)
Alkaline Phosphatase: 52 U/L (ref 38–126)
Anion gap: 9 (ref 5–15)
BUN: 17 mg/dL (ref 8–23)
CO2: 22 mmol/L (ref 22–32)
Calcium: 8.2 mg/dL — ABNORMAL LOW (ref 8.9–10.3)
Chloride: 108 mmol/L (ref 98–111)
Creatinine, Ser: 1.25 mg/dL — ABNORMAL HIGH (ref 0.61–1.24)
GFR calc Af Amer: >60 mL/min (ref >60)
GFR calc non Af Amer: 59 mL/min — ABNORMAL LOW (ref >60)
Glucose, Bld: 142 mg/dL — ABNORMAL HIGH (ref 70–99)
Potassium: 3.8 mmol/L (ref 3.5–5.1)
Sodium: 139 mmol/L (ref 135–145)
Total Bilirubin: 1.1 mg/dL (ref 0.3–1.2)
Total Protein: 5.8 g/dL — ABNORMAL LOW (ref 6.5–8.1)

## 2019-07-12 LAB — I-STAT CHEM 8, ED
BUN: 19 mg/dL (ref 8–23)
Calcium, Ion: 1.15 mmol/L (ref 1.15–1.40)
Chloride: 106 mmol/L (ref 98–111)
Creatinine, Ser: 1.3 mg/dL — ABNORMAL HIGH (ref 0.61–1.24)
Glucose, Bld: 141 mg/dL — ABNORMAL HIGH (ref 70–99)
HCT: 36 % — ABNORMAL LOW (ref 39.0–52.0)
Hemoglobin: 12.2 g/dL — ABNORMAL LOW (ref 13.0–17.0)
Potassium: 3.7 mmol/L (ref 3.5–5.1)
Sodium: 139 mmol/L (ref 135–145)
TCO2: 23 mmol/L (ref 22–32)

## 2019-07-12 LAB — CBG MONITORING, ED: Glucose-Capillary: 132 mg/dL — ABNORMAL HIGH (ref 70–99)

## 2019-07-12 LAB — LIPASE, BLOOD: Lipase: 19 U/L (ref 11–51)

## 2019-07-12 MED ORDER — SODIUM CHLORIDE 0.9 % IV BOLUS (SEPSIS)
1000.0000 mL | Freq: Once | INTRAVENOUS | Status: AC
  Administered 2019-07-12: 1000 mL via INTRAVENOUS

## 2019-07-12 NOTE — ED Notes (Signed)
Pt was discharged from the ED. Pt read and understood discharge paperwork. Pt had vital signs completed. Pt conscious, breathing, and A&Ox4. No distress noted. Pt speaking in complete sentences. Pt brought out of the ED via wheelchair.  

## 2019-07-12 NOTE — ED Provider Notes (Addendum)
TIME SEEN: 1:35 AM  CHIEF COMPLAINT: Syncope  HPI: Patient is a 69 year old male with history of kidney stones, hypertension who presents to the emergency department with syncopal event.  States that he just had his Coreg doubled and started this 2 days ago.  States tonight he felt like he was going to throw up and his stomach was upset he went to the bathroom.  While on the toilet he had a syncopal event and fell to the floor.    EMS at bedside and has provided information.  Initially decreased responsiveness with EMS.  Systolic blood pressure in the 80s.  Given 2 L of IV fluids and blood pressure improving as well as responsiveness.  Blood sugar in the 140s with EMS.  Patient denies any recent vomiting, diarrhea, bloody stools, melena, chest pain, shortness of breath.  He denies any headache.  Not on anticoagulants or antiplatelets.  No numbness, tingling or weakness.  States he did feel lightheaded today.  ROS: See HPI Constitutional: no fever  Eyes: no drainage  ENT: no runny nose   Cardiovascular:  no chest pain  Resp: no SOB  GI: no vomiting GU: no dysuria Integumentary: no rash  Allergy: no hives  Musculoskeletal: no leg swelling  Neurological: no slurred speech ROS otherwise negative  PAST MEDICAL HISTORY/PAST SURGICAL HISTORY:  Past Medical History:  Diagnosis Date  . Anxiety   . Colon polyp   . GERD (gastroesophageal reflux disease)   . Heart abnormality    slight enlarged arota- no change since previous ekg  . History of kidney stones   . Kidney stones    x1  . Sleep apnea    uses c-pap    MEDICATIONS:  Prior to Admission medications   Medication Sig Start Date End Date Taking? Authorizing Provider  amitriptyline (ELAVIL) 10 MG tablet Take 10 mg by mouth at bedtime.     [provider]  amLODipine (NORVASC) 5 MG tablet Take 5 mg by mouth daily. 05/08/19   [provider]  carvedilol (COREG) 6.25 MG tablet Take 1 tablet (6.25 mg total) by mouth 2  (two) times daily. 05/27/19 08/25/19  Park Liter, MD  losartan (COZAAR) 50 MG tablet Take 50 mg by mouth daily.    [provider]  naproxen sodium (ALEVE) 220 MG tablet Take 220 mg by mouth daily as needed.    [provider]  pantoprazole (PROTONIX) 40 MG tablet Take 40 mg by mouth daily.  06/12/13   [provider]  triazolam (HALCION) 0.25 MG tablet Take 0.25 mg by mouth at bedtime and may repeat dose one time if needed.    [provider]  valACYclovir (VALTREX) 1000 MG tablet Take 1 tablet by mouth daily. 09/27/18   [provider]    ALLERGIES:  Allergies  Allergen Reactions  . Penicillins     As child    SOCIAL HISTORY:  Social History   Tobacco Use  . Smoking status: Never Smoker  . Smokeless tobacco: Never Used  Substance Use Topics  . Alcohol use: No    FAMILY HISTORY: Family History  Problem Relation Age of Onset  . Asthma Father   . Emphysema Father   . Pancreatic cancer Neg Hx   . Throat cancer Neg Hx   . Stomach cancer Neg Hx   . Heart disease Neg Hx   . Diabetes Neg Hx   . Kidney disease Neg Hx   . Liver disease Neg Hx   .  Colon cancer Neg Hx     EXAM: BP 119/69 (BP Location: Right Arm)   Pulse 61   Resp 18   SpO2 96%  CONSTITUTIONAL: Alert and oriented x3 and responds appropriately to questions. Well-appearing; well-nourished; GCS 15 HEAD: Normocephalic; atraumatic EYES: Conjunctivae clear, PERRL, EOMI ENT: normal nose; no rhinorrhea; moist mucous membranes; pharynx without lesions noted; no dental injury; no septal hematoma NECK: Supple, no meningismus, no LAD; no midline spinal tenderness, step-off or deformity; trachea midline CARD: RRR; S1 and S2 appreciated; no murmurs, no clicks, no rubs, no gallops RESP: Normal chest excursion without splinting or tachypnea; breath sounds clear and equal bilaterally; no wheezes, no rhonchi, no rales; no hypoxia or respiratory distress CHEST:  chest wall  stable, no crepitus or ecchymosis or deformity, nontender to palpation; no flail chest ABD/GI: Normal bowel sounds; non-distended; soft, non-tender, no rebound, no guarding; no ecchymosis or other lesions noted, no masses appreciated BACK:  The back appears normal and is non-tender to palpation, there is no CVA tenderness; no midline spinal tenderness, step-off or deformity EXT: Normal ROM in all joints; non-tender to palpation; no edema; normal capillary refill; no cyanosis, no bony tenderness or bony deformity of patient's extremities, no joint effusion, compartments are soft, extremities are warm and well-perfused, no ecchymosis SKIN: Normal color for age and race; warm, previously diaphoretic NEURO: Moves all extremities equally, sensation to light touch intact diffusely, cranial nerves II through XII intact, normal speech PSYCH: The patient's mood and manner are appropriate. Grooming and personal hygiene are appropriate.  MEDICAL DECISION MAKING: Patient here after syncopal event.  I suspect this was secondary to increasing Coreg recently.  Blood glucose here normal.  Blood pressures have improved.  EKG reviewed and interpreted by myself and shows no arrhythmia, ischemia or interval abnormality.  Doubt ACS, PE, dissection, ruptured aneurysm, stroke, sepsis.  Will check labs, urine, orthostats.  Will p.o. challenge.  ED PROGRESS: 3:55 PM Labs have been independently reviewed and interpreted by myself.  Appears to have chronic kidney disease which is stable.  Normal electrolytes.  Normal hemoglobin.  LFTs, lipase normal.  Urine pending.  Orthostats pending.  Will p.o. challenge, ambulate.  5:00 AM  Pt now appears more alert and has better color than he did previously.  He states he is feeling better.  No chest or abdominal pain.  No events on the cardiac monitor here.  His orthostats are negative after 3 L of IV fluids and he has been able to ambulate.  Again suspect this is from recent change in  Coreg dose and he agrees.  He states he was on this new dose for 2 days.  He will go back to his original dose and contact his PCP.  He also reports that he has had decreased oral intake today.  I suspect this is contributing as well.  He is complaining of some neck soreness but no significant midline tenderness, step-off or deformity.  Have offered imaging which she declines at this time.  Still neurologically intact.  Patient comfortable with plan for discharge home with his wife who is at bedside.  Discussed return precautions.   At this time, I do not feel there is any life-threatening condition present. I have reviewed, interpreted and discussed all results (EKG, imaging, lab, urine as appropriate) and exam findings with patient/family. I have reviewed nursing notes and appropriate previous records.  I feel the patient is safe to be discharged home without further emergent workup and can continue workup as  an outpatient as needed. Discussed usual and customary return precautions. Patient/family verbalize understanding and are comfortable with this plan.  Outpatient follow-up has been provided as needed. All questions have been answered.    EKG Interpretation  Date/Time:  Friday July 12 2019 01:31:24 EDT Ventricular Rate:  61 PR Interval:    QRS Duration: 110 QT Interval:  466 QTC Calculation: 470 R Axis:   37 Text Interpretation: Sinus rhythm Incomplete left bundle branch block Low voltage, precordial leads No significant change since last tracing Confirmed by Pryor Curia [phone]) on 07/12/2019 1:37:08 AM          [name] was evaluated in Emergency Department on 07/12/2019 for the symptoms described in the history of present illness. He was evaluated in the context of the global COVID-19 pandemic, which necessitated consideration that the patient might be at risk for infection with the SARS-CoV-2 virus that causes COVID-19. Institutional protocols and algorithms that pertain to the  evaluation of patients at risk for COVID-19 are in a state of rapid change based on information released by regulatory bodies including the CDC and federal and state organizations. These policies and algorithms were followed during the patient's care in the ED.       Loleta Frommelt, Delice Bison, DO 07/12/19 0501    Lil Lepage, Delice Bison, DO 07/12/19 [id number]

## 2019-07-12 NOTE — Discharge Instructions (Signed)
Your labs, urine, EKG today were reassuring.  I suspect the cause of your syncopal event (episode of passing out) due to decreased oral intake today as well as increased carvedilol dose.  I recommend that you go back to your most recent carvedilol dose and contact your PCP for follow-up.  Please increase your water intake at home and I recommend avoiding caffeine, alcohol over the next several days.

## 2019-07-12 NOTE — ED Notes (Signed)
Pt given urinal, made aware that urine sample is needed.

## 2019-07-12 NOTE — ED Notes (Signed)
Pt ambulated in hallway with minimal assistance. Pt states he felt a little dizzy upon standing. Pt also complains of neck pain.

## 2019-07-13 ENCOUNTER — Other Ambulatory Visit (HOSPITAL_COMMUNITY): Payer: Self-pay | Admitting: Internal Medicine

## 2019-07-13 DIAGNOSIS — G44309 Post-traumatic headache, unspecified, not intractable: Secondary | ICD-10-CM

## 2019-07-14 DIAGNOSIS — R55 Syncope and collapse: Secondary | ICD-10-CM | POA: Insufficient documentation

## 2019-07-14 DIAGNOSIS — W19XXXA Unspecified fall, initial encounter: Secondary | ICD-10-CM | POA: Insufficient documentation

## 2019-07-14 DIAGNOSIS — M542 Cervicalgia: Secondary | ICD-10-CM | POA: Insufficient documentation

## 2019-07-14 DIAGNOSIS — R42 Dizziness and giddiness: Secondary | ICD-10-CM | POA: Insufficient documentation

## 2019-07-15 ENCOUNTER — Other Ambulatory Visit: Payer: Self-pay

## 2019-07-15 ENCOUNTER — Encounter (HOSPITAL_COMMUNITY): Payer: Self-pay

## 2019-07-15 ENCOUNTER — Other Ambulatory Visit (HOSPITAL_COMMUNITY): Payer: Self-pay | Admitting: Internal Medicine

## 2019-07-15 ENCOUNTER — Ambulatory Visit (HOSPITAL_COMMUNITY)
Admission: RE | Admit: 2019-07-15 | Discharge: 2019-07-15 | Disposition: A | Discharge status: Home / Self Care | Payer: PPO | Source: Ambulatory Visit | Attending: Internal Medicine | Admitting: Internal Medicine

## 2019-07-15 DIAGNOSIS — G44309 Post-traumatic headache, unspecified, not intractable: Secondary | ICD-10-CM | POA: Diagnosis not present

## 2019-07-15 DIAGNOSIS — R519 Headache, unspecified: Secondary | ICD-10-CM | POA: Diagnosis not present

## 2019-07-15 DIAGNOSIS — R42 Dizziness and giddiness: Secondary | ICD-10-CM | POA: Diagnosis not present

## 2019-07-15 DIAGNOSIS — S199XXA Unspecified injury of neck, initial encounter: Secondary | ICD-10-CM | POA: Diagnosis not present

## 2019-07-15 DIAGNOSIS — M542 Cervicalgia: Secondary | ICD-10-CM | POA: Diagnosis not present

## 2019-07-15 DIAGNOSIS — S0990XA Unspecified injury of head, initial encounter: Secondary | ICD-10-CM | POA: Diagnosis not present

## 2019-07-15 HISTORY — DX: Essential (primary) hypertension: I10

## 2019-07-15 MED ORDER — IOHEXOL 350 MG/ML SOLN
75.0000 mL | Freq: Once | INTRAVENOUS | Status: AC | PRN
  Administered 2019-07-15: 75 mL via INTRAVENOUS

## 2019-07-15 MED ORDER — SODIUM CHLORIDE (PF) 0.9 % IJ SOLN
INTRAMUSCULAR | Status: AC
  Filled 2019-07-15: qty 50

## 2019-07-16 DIAGNOSIS — R42 Dizziness and giddiness: Secondary | ICD-10-CM | POA: Diagnosis not present

## 2019-07-16 DIAGNOSIS — R55 Syncope and collapse: Secondary | ICD-10-CM | POA: Diagnosis not present

## 2019-07-16 DIAGNOSIS — M542 Cervicalgia: Secondary | ICD-10-CM | POA: Diagnosis not present

## 2019-07-16 DIAGNOSIS — W19XXXD Unspecified fall, subsequent encounter: Secondary | ICD-10-CM | POA: Diagnosis not present

## 2019-07-30 ENCOUNTER — Other Ambulatory Visit: Payer: Self-pay | Admitting: Cardiology

## 2019-08-16 ENCOUNTER — Other Ambulatory Visit: Payer: Self-pay | Admitting: Cardiology

## 2019-08-19 DIAGNOSIS — W19XXXD Unspecified fall, subsequent encounter: Secondary | ICD-10-CM | POA: Diagnosis not present

## 2019-08-19 DIAGNOSIS — R55 Syncope and collapse: Secondary | ICD-10-CM | POA: Diagnosis not present

## 2019-08-19 DIAGNOSIS — I712 Thoracic aortic aneurysm, without rupture: Secondary | ICD-10-CM | POA: Diagnosis not present

## 2019-08-19 DIAGNOSIS — R42 Dizziness and giddiness: Secondary | ICD-10-CM | POA: Diagnosis not present

## 2019-08-19 DIAGNOSIS — M542 Cervicalgia: Secondary | ICD-10-CM | POA: Diagnosis not present

## 2019-08-19 DIAGNOSIS — E663 Overweight: Secondary | ICD-10-CM | POA: Diagnosis not present

## 2019-08-19 DIAGNOSIS — I10 Essential (primary) hypertension: Secondary | ICD-10-CM | POA: Diagnosis not present

## 2019-08-19 DIAGNOSIS — R05 Cough: Secondary | ICD-10-CM | POA: Diagnosis not present

## 2019-08-22 ENCOUNTER — Other Ambulatory Visit: Payer: Self-pay

## 2019-08-26 ENCOUNTER — Ambulatory Visit: Payer: PPO | Admitting: Cardiology

## 2019-08-26 ENCOUNTER — Encounter: Payer: Self-pay | Admitting: Cardiology

## 2019-08-26 ENCOUNTER — Other Ambulatory Visit: Payer: Self-pay

## 2019-08-26 ENCOUNTER — Telehealth: Payer: Self-pay | Admitting: Cardiology

## 2019-08-26 VITALS — BP 132/88 | HR 85 | Ht 73.0 in | Wt 205.0 lb

## 2019-08-26 DIAGNOSIS — I7121 Aneurysm of the ascending aorta, without rupture: Secondary | ICD-10-CM

## 2019-08-26 DIAGNOSIS — R55 Syncope and collapse: Secondary | ICD-10-CM

## 2019-08-26 DIAGNOSIS — I712 Thoracic aortic aneurysm, without rupture: Secondary | ICD-10-CM | POA: Diagnosis not present

## 2019-08-26 DIAGNOSIS — R0989 Other specified symptoms and signs involving the circulatory and respiratory systems: Secondary | ICD-10-CM

## 2019-08-26 DIAGNOSIS — R42 Dizziness and giddiness: Secondary | ICD-10-CM | POA: Diagnosis not present

## 2019-08-26 DIAGNOSIS — Z8679 Personal history of other diseases of the circulatory system: Secondary | ICD-10-CM | POA: Diagnosis not present

## 2019-08-26 DIAGNOSIS — I10 Essential (primary) hypertension: Secondary | ICD-10-CM

## 2019-08-26 MED ORDER — AMLODIPINE BESYLATE 2.5 MG PO TABS: 2.5000 mg | ORAL_TABLET | Freq: Every day | ORAL | 3 refills | Status: DC

## 2019-08-26 NOTE — Progress Notes (Signed)
Cardiology Office Note:    Date:  08/26/2019   ID:  [facility], DOB 1950/10/04, MRN [id number]  PCP:  Ricardo Margarita, DO  Cardiologist:  No primary care provider on file.  Electrophysiologist:  None   Referring MD: Ricardo Margarita, DO   " I was asked to see cardiology because I have a syncope episode"  History of Present Illness:    [facility] is a 69 y.o. male with past medical history significant for hypertrophic cardiomyopathy about wall thickness only 50 mm, no obstruction, essential hypertension, dyslipidemia.  Comes today to my for follow-up.  The patient was last seen by Dr. Agustin Morris on July 25, 2019 at that time his carvedilol was increased to 6.25 mg twice a day.  He tells me that after that he saw his PCP (Dr. Francesco [facility]) who we can increase his carvedilol to 12.5 mg twice a day.  The patient explains to me that on April 9 he was at his girlfriend house he went to use the bathroom and he felt dizzy.  He notes that he did go for the first time after that he flushed.  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  He notes that he did hit his head.  He was brought to the emergency department at [facility]  Presentation per ED notes his systolic blood pressure was in the 80s, he was given 2 L IV fluids which improved his responsiveness as well as his blood pressure.  He was discharged home to see his PCP as well as cardiologist.  Patient states that since this episode his carvedilol was decreased to a lower dose he has been taking this medication.  But recently he developed dizziness and have stopped taking his carvedilol.  He described the dizziness as an intermittent sensation light at times when he is lying down and when he turned his head he feel like the room is spinning.  He  is concerned about this.  He denies any chest pain, shortness of breath.  He also notes that at times when he stands up he feels transient dizziness but does not last long and does not feel as if the room is spinning when this occurs when he is lying down.  No other complaints at this time.   Of note the patient states during our visit" I am not happy because I think there are things that people are not telling me about what is going on with my health"   Past Medical History:  Diagnosis Date  . ALLERGIC RHINITIS 05/31/2009   Qualifier: Diagnosis of  By: [facility] Boots MD, [facility]   . Anxiety   . Ascending aortic aneurysm (HCC) 4.6 cm in summer 2019 11/19/2018  . Colon polyp   . Diverticulosis of colon without hemorrhage 02/21/2013  . Dyspepsia and other specified disorders of function of stomach 04/18/2013  . Essential hypertension 05/27/2019  . GERD (gastroesophageal reflux disease)   . H/O hypertrophic cardiomyopathy 11/19/2018   No obstruction, in the summer 2018 Wall thickness was 15 mm  . Heart abnormality    slight enlarged arota- no change since previous ekg  . History of kidney stones   . Hypertension   . Hypertensive retinopathy of both eyes 06/19/2018  . INSOMNIA 05/26/2009  Qualifier: Diagnosis of  By: [facility] Boots MD, [facility]   . Kidney stones    x1  . Nuclear sclerotic cataract of both eyes 06/19/2018  . OSA (obstructive sleep apnea) 05/26/2009   S/p UPPP 2006 -208lbs - RDI 13/h corrected by CPAP 7 cm    . 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 Retinal edema 06/19/2018  . RHINITIS 05/26/2009   Qualifier: Diagnosis of  By: [facility] Boots MD, [facility]   . Sleep apnea    uses c-pap  . Unspecified constipation 07/01/2013    Past Surgical History:  Procedure Laterality Date  . APPENDECTOMY  1956  . COLONOSCOPY    . EXTRACORPOREAL SHOCK WAVE LITHOTRIPSY Left 01/25/2018   Procedure: EXTRACORPOREAL SHOCK WAVE  LITHOTRIPSY (ESWL);  Surgeon: Ricardo Hughs, MD;  Location: [facility];  Service: Urology;  Laterality: Left;  . NASAL SINUS SURGERY    . tonsillectomy      Current Medications: Current Meds  Medication Sig  . amitriptyline (ELAVIL) 10 MG tablet Take 10 mg by mouth at bedtime.   Marland Kitchen amLODipine (NORVASC) 2.5 MG tablet Take 1 tablet (2.5 mg total) by mouth daily.  Marland Kitchen loratadine (CLARITIN) 10 MG tablet Take 10 mg by mouth daily as needed.  Marland Kitchen losartan (COZAAR) 50 MG tablet Take 50 mg by mouth daily.  . naproxen sodium (ALEVE) 220 MG tablet Take 220 mg by mouth daily as needed.  . pantoprazole (PROTONIX) 40 MG tablet Take 40 mg by mouth daily.   . triazolam (HALCION) 0.25 MG tablet Take 0.25 mg by mouth at bedtime and may repeat dose one time if needed.  . valACYclovir (VALTREX) 1000 MG tablet Take 1 tablet by mouth daily.  . [DISCONTINUED] amLODipine (NORVASC) 5 MG tablet Take 5 mg by mouth daily.     Allergies:   Penicillins   Social History   Socioeconomic History  . Marital status: Single    Spouse name: Not on file  . Number of children: 1  . Years of education: Not on file  . Highest education level: Not on file  Occupational History  . Occupation: Engineer, production  Tobacco Use  . Smoking status: Never Smoker  . Smokeless tobacco: Never Used  Substance and Sexual Activity  . Alcohol use: No  . Drug use: No  . Sexual activity: Not on file  Other Topics Concern  . Not on file  Social History Narrative  . Not on file   Social Determinants of Health   Financial Resource Strain:   . Difficulty of Paying Living Expenses:   Food Insecurity:   . Worried About Charity fundraiser in the Last Year:   . Arboriculturist in the Last Year:   Transportation Needs:   . Film/video editor (Medical):   Marland Kitchen Lack of Transportation (Non-Medical):   Physical Activity:   . Days of Exercise per Week:   . Minutes of Exercise per Session:   Stress:   . Feeling of Stress :   Social  Connections:   . Frequency of Communication with Friends and Family:   . Frequency of Social Gatherings with Friends and Family:   . Attends Religious Services:   . Active Member of Clubs or Organizations:   . Attends Archivist Meetings:   Marland Kitchen Marital Status:      Family History: The patient's family history includes Asthma in his father; Emphysema in his father.  There is no history of Pancreatic cancer, Throat cancer, Stomach cancer, Heart disease, Diabetes, Kidney disease, Liver disease, or Colon cancer.  ROS:   Review of Systems  Constitution: Negative for decreased appetite, fever and weight gain.  HENT: Negative for congestion, ear discharge, hoarse voice and sore throat.   Eyes: Negative for discharge, redness, vision loss in right eye and visual halos.  Cardiovascular: Negative for chest pain, dyspnea on exertion, leg swelling, orthopnea and palpitations.  Respiratory: Negative for cough, hemoptysis, shortness of breath and snoring.   Endocrine: Negative for heat intolerance and polyphagia.  Hematologic/Lymphatic: Negative for bleeding problem. Does not bruise/bleed easily.  Skin: Negative for flushing, nail changes, rash and suspicious lesions.  Musculoskeletal: Negative for arthritis, joint pain, muscle cramps, myalgias, neck pain and stiffness.  Gastrointestinal: Negative for abdominal pain, bowel incontinence, diarrhea and excessive appetite.  Genitourinary: Negative for decreased libido, genital sores and incomplete emptying.  Neurological: Negative for brief paralysis, focal weakness, headaches and loss of balance.  Psychiatric/Behavioral: Negative for altered mental status, depression and suicidal ideas.  Allergic/Immunologic: Negative for HIV exposure and persistent infections.    EKGs/Labs/Other Studies Reviewed:    The following studies were reviewed today:   EKG:  The ekg ordered today demonstrates sinus rhythm, heart rate 85 bpm, nonspecific ST changes,  compared to EKG done on July 12, 2019 the nonspecific interventricular conduction defect has resolved.  TTE IMPRESSIONS  1. The left ventricle has mild-moderately reduced systolic function, with  an ejection fraction of 40-45%. The cavity size was moderately dilated.  There is mildly increased left ventricular wall thickness. Left ventricular diastolic parameters were normal.  2. 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  3. The right ventricle has normal systolic function. The cavity was  normal. There is no increase in right ventricular wall thickness.  4. Left atrial size was mildly dilated.  5. Mild thickening of the mitral valve leaflet.  6. The aortic valve is tricuspid. Mild thickening of the aortic valve.  Mild calcification of the aortic valve. Aortic valve regurgitation is mild  by color flow Doppler.  7. The aorta is abnormal unless otherwise noted.  8. There is moderate to severe dilatation of the aortic root measuring 46  mm.  9. Suggest CTA to further evaluate ascending aorta if not already done.   Zio monitor  Baseline rhythm: Sinus  Minimum heart rate: 46 BPM.  Average heart rate: 76 BPM.  Maximal heart rate 197 BPM.  Atrial arrhythmia: Total of 7 SVT fastest episodes 8 beats at 197, longest episode 12 beats at rate of 105  Ventricular arrhythmia: Infrequent PVCs  Conduction abnormality: None  Symptoms: None  Recent Labs: 07/12/2019: ALT 19; BUN 17; Creatinine, Ser 1.25; Hemoglobin 13.5; Platelets 219; Potassium 3.8; Sodium 139  Recent Lipid Panel No results found for: CHOL, TRIG, HDL, CHOLHDL, VLDL, LDLCALC, LDLDIRECT  Physical Exam:    VS:  BP 132/88   Pulse 85   Ht 6\' 1"  (1.854 m)   Wt 205 lb (93 kg)   BMI 27.05 kg/m     Wt Readings from Last 3 Encounters:  08/26/19 205 lb (93 kg)  05/27/19 208 lb 1.9 oz (94.4 kg)    11/19/18 212 lb 12.8 oz (96.5 kg)     GEN: Well nourished, well developed in no acute distress HEENT: Normal NECK: No JVD; No carotid bruits LYMPHATICS: No lymphadenopathy CARDIAC:  S1S2 noted,RRR, no murmurs, rubs, gallops RESPIRATORY:  Clear to auscultation without rales, wheezing or rhonchi  ABDOMEN: Soft, non-tender, non-distended, +bowel sounds, no guarding. EXTREMITIES: No edema, No cyanosis, no clubbing MUSCULOSKELETAL:  No deformity  SKIN: Warm and dry NEUROLOGIC:  Alert and oriented x 3, non-focal PSYCHIATRIC:  Normal affect, good insight  ASSESSMENT:    1. Dizziness   2. Syncope and collapse   3. H/O hypertrophic cardiomyopathy   4. Essential hypertension   5. Depressed left ventricular ejection fraction   6. Vertigo   7. Ascending aortic aneurysm (HCC) 4.6 cm in summer 2019    PLAN:    1.  His dizziness does sound more like vertigo.  Therefore I am going to have the patient see neurology will refer him today.  In the meantime we will keep him off of his beta-blocker as well as cut back on the amlodipine to 2.5 mg daily.  2.  Syncope-based on history does sound more like vasovagal syncope.  He also was giving IV fluid which he responded very well to.    3.  Has history of hypertrophic cardiomyopathy still remains ambiguous-his most recent echocardiogram in August 2020 does not suggest HCM but there are noted documentation for his history of HCM.  Given his syncope and concern for HCM I think is appropriate to make sure that this diagnosis is clear as this could affect potential treatment plan for this patient.  Therefore I am going to order a cardiac MRI to understand his diagnosis of his HCM and if there are any late gadolinium enhancement for help in future decision making.  4.  Hypertension-his blood pressure in the office today is acceptable-he did bring his blood pressure records which his systolic blood pressure at home is dropping to the low with systolic between  [id number] millimeters mercury.  This is why is important to hold off on restarting the beta-blocker and decreasing his amlodipine to 2.5 mg daily.  5.  Ascending aorta aneurysm-repeat planning for August 2021.  6.  Depressed ejection fraction-continue ARB for now hold off on adding beta-blocker given the patient reported dizziness and concerned that this may be the problem.  He also prefers to stay off of this medication at this time.  The patient is in agreement with the above plan. The patient left the office in stable condition.  The patient will follow up in 1 month with Dr. Agustin [facility].   Medication Adjustments/Labs and Tests Ordered: Current medicines are reviewed at length with the patient today.  Concerns regarding medicines are outlined above.  Orders Placed This Encounter  Procedures  . MR CARDIAC MORPHOLOGY W WO CONTRAST  . Ambulatory referral to Neurology  . EKG 12-Lead   Meds ordered this encounter  Medications  . amLODipine (NORVASC) 2.5 MG tablet    Sig: Take 1 tablet (2.5 mg total) by mouth daily.    Dispense:  90 tablet    Refill:  3    Patient Instructions  Medication Instructions:  Your physician has recommended you make the following change in your medication:  1-STOP Carvedilol (Coreg) 2-Decrease Amlodipine to 2.5 mg by mouth daily.  *If you need a refill on your cardiac medications before your next appointment, please call your pharmacy*  Lab Work: If you have labs (blood work) drawn today and your tests are completely normal, you will receive your results only by: Marland Kitchen MyChart Message (if you have MyChart) OR . A paper copy in the mail If you have any lab  test that is abnormal or we need to change your treatment, we will call you to review the results.  Testing/Procedures: Your physician has requested that you have a cardiac MRI. Cardiac MRI uses a computer to create images of your heart as its beating, producing both still and moving pictures of your heart and  major blood vessels. For further information please visit [email or web address] Please follow the instruction sheet given to you today for more information.  Follow-Up: At [facility], you and your health needs are our priority.  As part of our continuing mission to provide you with exceptional heart care, we have created designated Provider Care Teams.  These Care Teams include your primary Cardiologist (physician) and Advanced Practice Providers (APPs -  Physician Assistants and Nurse Practitioners) who all work together to provide you with the care you need, when you need it.  We recommend signing up for the patient portal called "MyChart".  Sign up information is provided on this After Visit Summary.  MyChart is used to connect with patients for Virtual Visits (Telemedicine).  Patients are able to view lab/test results, encounter notes, upcoming appointments, etc.  Non-urgent messages can be sent to your provider as well.   To learn more about what you can do with MyChart, go to [email or web address].    Your next appointment:   1 month(s)  The format for your next appointment:   In Person  Provider:   Jenne Campus, MD   You have been referred to Neurology.      Adopting a Healthy Lifestyle.  Know what a healthy weight is for you (roughly BMI <25) and aim to maintain this   Aim for 7+ servings of fruits and vegetables daily   65-80+ fluid ounces of water or unsweet tea for healthy kidneys   Limit to max 1 drink of alcohol per day; avoid smoking/tobacco   Limit animal fats in diet for cholesterol and heart health - choose grass fed whenever available   Avoid highly processed foods, and foods high in saturated/trans fats   Aim for low stress - take time to unwind and care for your mental health   Aim for 150 min of moderate intensity exercise weekly for heart health, and weights twice weekly for bone health   Aim for 7-9 hours of sleep daily   When it comes to  diets, agreement about the perfect plan isnt easy to find, even among the experts. Experts at the [employer] developed an idea known as the Healthy Eating Plate. Just imagine a plate divided into logical, healthy portions.   The emphasis is on diet quality:   Load up on vegetables and fruits - one-half of your plate: Aim for color and variety, and remember that potatoes dont count.   Go for whole grains - one-quarter of your plate: Whole wheat, barley, wheat berries, quinoa, oats, brown rice, and foods made with them. If you want pasta, go with whole wheat pasta.   Protein power - one-quarter of your plate: Fish, chicken, beans, and nuts are all healthy, versatile protein sources. Limit red meat.   The diet, however, does go beyond the plate, offering a few other suggestions.   Use healthy plant oils, such as olive, canola, soy, corn, sunflower and peanut. Check the labels, and avoid partially hydrogenated oil, which have unhealthy trans fats.   If youre thirsty, drink water. Coffee and tea are good in moderation, but skip sugary drinks and limit milk and dairy products  to one or two daily servings.   The type of carbohydrate in the diet is more important than the amount. Some sources of carbohydrates, such as vegetables, fruits, whole grains, and beans-are healthier than others.   Finally, stay active  Signed, Berniece Salines, DO  08/26/2019 11:59 AM    [place]

## 2019-08-26 NOTE — Telephone Encounter (Signed)
**Note De-identified Temica Righetti Obfuscation**  **Note De-Identified Kelsie Kramp Obfuscation** Patient is calling because he cannot see the neurologist until August 25th at [facility]. He wants to know if he should keep his appt with Agustin Cree or wait until after he sees the neurologist. And should he get his MRI first before his appt with Agustin Cree. Please advise.

## 2019-08-26 NOTE — Patient Instructions (Addendum)
Medication Instructions:  Your physician has recommended you make the following change in your medication:  1-STOP Carvedilol (Coreg) 2-Decrease Amlodipine to 2.5 mg by mouth daily.  *If you need a refill on your cardiac medications before your next appointment, please call your pharmacy*  Lab Work: If you have labs (blood work) drawn today and your tests are completely normal, you will receive your results only by: Marland Kitchen MyChart Message (if you have MyChart) OR . A paper copy in the mail If you have any lab test that is abnormal or we need to change your treatment, we will call you to review the results.  Testing/Procedures: Your physician has requested that you have a cardiac MRI. Cardiac MRI uses a computer to create images of your heart as its beating, producing both still and moving pictures of your heart and major blood vessels. For further information please visit [email or web address] Please follow the instruction sheet given to you today for more information.  Follow-Up: At [facility], you and your health needs are our priority.  As part of our continuing mission to provide you with exceptional heart care, we have created designated Provider Care Teams.  These Care Teams include your primary Cardiologist (physician) and Advanced Practice Providers (APPs -  Physician Assistants and Nurse Practitioners) who all work together to provide you with the care you need, when you need it.  We recommend signing up for the patient portal called "MyChart".  Sign up information is provided on this After Visit Summary.  MyChart is used to connect with patients for Virtual Visits (Telemedicine).  Patients are able to view lab/test results, encounter notes, upcoming appointments, etc.  Non-urgent messages can be sent to your provider as well.   To learn more about what you can do with MyChart, go to [email or web address].    Your next appointment:   1 month(s)  The format for your next appointment:    In Person  Provider:   Jenne Campus, MD   You have been referred to Neurology.

## 2019-08-28 NOTE — Telephone Encounter (Signed)
Called patient and informed him that Dr. Agustin Cree would like to see him still. He verbally understood. Patient advised to call us with any other concerns.

## 2019-08-28 NOTE — Telephone Encounter (Signed)
Left message for patient to return call.

## 2019-08-28 NOTE — Telephone Encounter (Signed)
Follow up ° ° °Patient is returning your call. Please call. ° ° ° °

## 2019-09-03 DIAGNOSIS — N5201 Erectile dysfunction due to arterial insufficiency: Secondary | ICD-10-CM | POA: Diagnosis not present

## 2019-09-03 DIAGNOSIS — F5221 Male erectile disorder: Secondary | ICD-10-CM | POA: Diagnosis not present

## 2019-09-11 ENCOUNTER — Other Ambulatory Visit: Payer: Self-pay

## 2019-09-11 ENCOUNTER — Ambulatory Visit (INDEPENDENT_AMBULATORY_CARE_PROVIDER_SITE_OTHER): Payer: PPO | Admitting: Otolaryngology

## 2019-09-11 ENCOUNTER — Encounter (INDEPENDENT_AMBULATORY_CARE_PROVIDER_SITE_OTHER): Payer: Self-pay | Admitting: Otolaryngology

## 2019-09-11 VITALS — Temp 98.1°F

## 2019-09-11 DIAGNOSIS — H9311 Tinnitus, right ear: Secondary | ICD-10-CM

## 2019-09-11 DIAGNOSIS — R42 Dizziness and giddiness: Secondary | ICD-10-CM | POA: Diagnosis not present

## 2019-09-11 NOTE — Progress Notes (Signed)
HPI: Ricardo Morris is a 69 y.o. male who presents for evaluation of high-pitched sound in his right ear.  He has not noticed any hearing loss.  Apparently this began after he fell in his bathroom hitting the left side of his head.  He has been having some dizziness also but no true vertigo.  He stated that the dizziness began following when he fell in the bathroom and his head.  The dizziness is doing better but is not fully gone.  He is scheduled to see a neurologist in August concerning his dizziness.  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  He had a CT scan of his head after the fall that was clear and on review of the sinuses the sinuses likewise were clear. His main complaint today is the ringing or high-pitched cricket sound in his right ear.  Past Medical History:  Diagnosis Date  . ALLERGIC RHINITIS 05/31/2009   Qualifier: Diagnosis of  By: Annamaria Boots MD, Clinton D   . Anxiety   . Ascending aortic aneurysm (HCC) 4.6 cm in summer 2019 11/19/2018  . Colon polyp   . Diverticulosis of colon without hemorrhage 02/21/2013  . Dyspepsia and other specified disorders of function of stomach 04/18/2013  . Essential hypertension 05/27/2019  . GERD (gastroesophageal reflux disease)   . H/O hypertrophic cardiomyopathy 11/19/2018   No obstruction, in the summer 2018 Wall thickness was 15 mm  . Heart abnormality    slight enlarged arota- no change since previous ekg  . History of kidney stones   . Hypertension   . Hypertensive retinopathy of both eyes 06/19/2018  . INSOMNIA 05/26/2009   Qualifier: Diagnosis of  By: Annamaria Boots MD, Clinton D   . Kidney stones    x1  . Nuclear sclerotic cataract of both eyes 06/19/2018  . OSA (obstructive sleep apnea) 05/26/2009   S/p UPPP 2006 -208lbs - RDI 13/h corrected by CPAP 7 cm    . 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 Retinal edema 06/19/2018  . RHINITIS  05/26/2009   Qualifier: Diagnosis of  By: Annamaria Boots MD, Clinton D   . Sleep apnea    uses c-pap  . Unspecified constipation 07/01/2013   Past Surgical History:  Procedure Laterality Date  . APPENDECTOMY  1956  . COLONOSCOPY    . EXTRACORPOREAL SHOCK WAVE LITHOTRIPSY Left 01/25/2018   Procedure: EXTRACORPOREAL SHOCK WAVE LITHOTRIPSY (ESWL);  Surgeon: Ardis Hughs, MD;  Location: [facility];  Service: Urology;  Laterality: Left;  . NASAL SINUS SURGERY    . tonsillectomy     Social History   Socioeconomic History  . Marital status: Single    Spouse name: Not on file  . Number of children: 1  . Years of education: Not on file  . Highest education level: Not on file  Occupational History  . Occupation: Engineer, production  Tobacco Use  . Smoking status: Never Smoker  . Smokeless tobacco: Never Used  Substance and Sexual Activity  . Alcohol use: No  . Drug use: No  . Sexual activity: Not on file  Other Topics Concern  . Not on file  Social History Narrative  . Not on file   Social Determinants of Health   Financial Resource Strain:   . Difficulty of Paying Living Expenses:   Food Insecurity:   . Worried About Charity fundraiser in the Last Year:   .  Ran Out of Food in the Last Year:   Transportation Needs:   . Film/video editor (Medical):   Marland Kitchen Lack of Transportation (Non-Medical):   Physical Activity:   . Days of Exercise per Week:   . Minutes of Exercise per Session:   Stress:   . Feeling of Stress :   Social Connections:   . Frequency of Communication with Friends and Family:   . Frequency of Social Gatherings with Friends and Family:   . Attends Religious Services:   . Active Member of Clubs or Organizations:   . Attends Archivist Meetings:   Marland Kitchen Marital Status:    Family History  Problem Relation Age of Onset  . Asthma Father   . Emphysema Father   . Pancreatic cancer Neg Hx   . Throat cancer Neg Hx   . Stomach cancer Neg Hx   . Heart disease  Neg Hx   . Diabetes Neg Hx   . Kidney disease Neg Hx   . Liver disease Neg Hx   . Colon cancer Neg Hx    Allergies  Allergen Reactions  . Penicillins     As child   Prior to Admission medications   Medication Sig Start Date End Date Taking? Authorizing Provider  amitriptyline (ELAVIL) 10 MG tablet Take 10 mg by mouth at bedtime.    Yes [provider]  amLODipine (NORVASC) 2.5 MG tablet Take 1 tablet (2.5 mg total) by mouth daily. 08/26/19  Yes Tobb, Kardie, DO  loratadine (CLARITIN) 10 MG tablet Take 10 mg by mouth daily as needed. 08/12/19  Yes [provider]  losartan (COZAAR) 50 MG tablet Take 50 mg by mouth daily.   Yes [provider]  naproxen sodium (ALEVE) 220 MG tablet Take 220 mg by mouth daily as needed.   Yes [provider]  pantoprazole (PROTONIX) 40 MG tablet Take 40 mg by mouth daily.  06/12/13  Yes [provider]  triazolam (HALCION) 0.25 MG tablet Take 0.25 mg by mouth at bedtime and may repeat dose one time if needed.   Yes [provider]  valACYclovir (VALTREX) 1000 MG tablet Take 1 tablet by mouth daily. 09/27/18  Yes [provider]     Positive ROS: Otherwise negative  All other systems have been reviewed and were otherwise negative with the exception of those mentioned in the HPI and as above.  Physical Exam: Constitutional: Alert, well-appearing, no acute distress Ears: External ears without lesions or tenderness. Ear canals are clear bilaterally with intact, clear TMs bilaterally.  On hearing screening with a 512 1024 tuning fork he heard well in both ears with no significant hearing loss using the 1024 tuning fork..  On Dix-Hallpike testing there is no clinical evidence of BPPV. Nasal: External nose without lesions. Septum midline with clear nasal passages.  Oral: Lips and gums without lesions. Tongue and palate mucosa without lesions. Posterior oropharynx clear. Neck: No palpable adenopathy or  masses Respiratory: Breathing comfortably  Skin: No facial/neck lesions or rash noted.  Procedures  Assessment: Right ear tinnitus probably related to trauma to the head or concussion.  Plan: I would expect this to gradually improve with time.  I discussed with him concerning using masking noise to help cope with this.  On clinical exam in the office he has no discernible hearing loss. However if the tinnitus persists beyond 2 or 3 months he will call us back to schedule audiologic testing. No clinical evidence of BPPV.  I suspect the dizziness is most likely secondary to concussion or head trauma.  Radene Journey, MD

## 2019-09-16 ENCOUNTER — Ambulatory Visit: Payer: PPO | Admitting: Cardiology

## 2019-10-09 ENCOUNTER — Telehealth: Payer: Self-pay | Admitting: Cardiology

## 2019-10-09 NOTE — Telephone Encounter (Signed)
Spoke with patient to discuss scheduling Cardiac MRI ordered by Dr. Harriet Masson

## 2019-10-10 ENCOUNTER — Encounter: Payer: Self-pay | Admitting: Cardiology

## 2019-10-10 NOTE — Telephone Encounter (Signed)
Spoke with patient regarding appointment for Cardiac MRI scheduled Monday 11/18/19 at 9:00 am at [facility]---arrival time is 8:30am 1st floor admissions office.  Will mail information to patient and it is also in My Chart.  Patient voiced his understanding.Marland Kitchen

## 2019-10-11 ENCOUNTER — Ambulatory Visit: Payer: PPO | Admitting: Cardiology

## 2019-10-11 ENCOUNTER — Encounter: Payer: Self-pay | Admitting: Cardiology

## 2019-10-11 ENCOUNTER — Other Ambulatory Visit: Payer: Self-pay

## 2019-10-11 VITALS — BP 126/92 | HR 77 | Ht 73.0 in | Wt 200.8 lb

## 2019-10-11 DIAGNOSIS — I10 Essential (primary) hypertension: Secondary | ICD-10-CM | POA: Diagnosis not present

## 2019-10-11 DIAGNOSIS — Z8679 Personal history of other diseases of the circulatory system: Secondary | ICD-10-CM | POA: Diagnosis not present

## 2019-10-11 DIAGNOSIS — I712 Thoracic aortic aneurysm, without rupture: Secondary | ICD-10-CM | POA: Diagnosis not present

## 2019-10-11 DIAGNOSIS — I7121 Aneurysm of the ascending aorta, without rupture: Secondary | ICD-10-CM

## 2019-10-11 NOTE — Progress Notes (Signed)
Cardiology Office Note:    Date:  10/11/2019   ID:  Ricardo Morris, DOB 06/08/1950, MRN [id number]  PCP:  Sueanne Margarita, DO  Cardiologist:  Jenne Campus, MD    Referring MD: Sueanne Margarita, DO   No chief complaint on file. I am doing much better  History of Present Illness:    Ricardo Morris is a 70 y.o. male with diagnosis of hypertrophic cardiomyopathy however this is a questionable diagnosis the wall thickness is only 15 mm, he does have hypertension so we may be dealing simply with left ventricle hypertrophy related to high blood pressure.  Recently he had episode of passing out it looks vasovagal he went to the restroom in the middle of the night he did have some stomach upset he did have a bowel movement try to get up and next thing he knew he is waking up.  He probably did have concussion since he does have some monitoring.  After that he will been complaining of having some dizziness looks more like a vertigo.  All testing subsided right now and he is feeling well.  There appropriately he was scheduled to have MRI and he is scheduled to have this test done next month.  We will be anxiously waiting to see if there is any evidence of hypertrophic cardiomyopathy.  If it is he may require wearing monitor.  Past Medical History:  Diagnosis Date  . ALLERGIC RHINITIS 05/31/2009   Qualifier: Diagnosis of  By: Annamaria Boots MD, Clinton D   . Anxiety   . Ascending aortic aneurysm (HCC) 4.6 cm in summer 2019 11/19/2018  . Colon polyp   . Diverticulosis of colon without hemorrhage 02/21/2013  . Dyspepsia and other specified disorders of function of stomach 04/18/2013  . Essential hypertension 05/27/2019  . GERD (gastroesophageal reflux disease)   . H/O hypertrophic cardiomyopathy 11/19/2018   No obstruction, in the summer 2018 Wall thickness was 15 mm  . Heart abnormality    slight enlarged arota- no change since previous ekg  . History of kidney stones   . Hypertension   . Hypertensive retinopathy  of both eyes 06/19/2018  . INSOMNIA 05/26/2009   Qualifier: Diagnosis of  By: Annamaria Boots MD, Clinton D   . Kidney stones    x1  . Nuclear sclerotic cataract of both eyes 06/19/2018  . OSA (obstructive sleep apnea) 05/26/2009   S/p UPPP 2006 -208lbs - RDI 13/h corrected by CPAP 7 cm    . 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 Retinal edema 06/19/2018  . RHINITIS 05/26/2009   Qualifier: Diagnosis of  By: Annamaria Boots MD, Clinton D   . Sleep apnea    uses c-pap  . Unspecified constipation 07/01/2013    Past Surgical History:  Procedure Laterality Date  . APPENDECTOMY  1956  . COLONOSCOPY    . EXTRACORPOREAL SHOCK WAVE LITHOTRIPSY Left 01/25/2018   Procedure: EXTRACORPOREAL SHOCK WAVE LITHOTRIPSY (ESWL);  Surgeon: Ardis Hughs, MD;  Location: [facility];  Service: Urology;  Laterality: Left;  . NASAL SINUS SURGERY    . tonsillectomy      Current Medications: Current Meds  Medication Sig  . amitriptyline (ELAVIL) 10 MG tablet Take 10 mg by mouth at bedtime.   Marland Kitchen amLODipine (NORVASC) 2.5 MG tablet Take 1 tablet (2.5 mg total) by mouth daily.  Marland Kitchen loratadine (CLARITIN) 10 MG tablet Take 10 mg by mouth daily as needed.  Marland Kitchen losartan (COZAAR)  50 MG tablet Take 50 mg by mouth daily.  . naproxen sodium (ALEVE) 220 MG tablet Take 220 mg by mouth daily as needed.  . pantoprazole (PROTONIX) 40 MG tablet Take 40 mg by mouth daily.   . triazolam (HALCION) 0.25 MG tablet Take 0.25 mg by mouth at bedtime and may repeat dose one time if needed.  . valACYclovir (VALTREX) 1000 MG tablet Take 1 tablet by mouth daily.     Allergies:   Penicillins   Social History   Socioeconomic History  . Marital status: Single    Spouse name: Not on file  . Number of children: 1  . Years of education: Not on file  . Highest education level: Not on file  Occupational History  . Occupation: Engineer, production  Tobacco Use  . Smoking status: Never  Smoker  . Smokeless tobacco: Never Used  Vaping Use  . Vaping Use: Never used  Substance and Sexual Activity  . Alcohol use: No  . Drug use: No  . Sexual activity: Not on file  Other Topics Concern  . Not on file  Social History Narrative  . Not on file   Social Determinants of Health   Financial Resource Strain:   . Difficulty of Paying Living Expenses:   Food Insecurity:   . Worried About Charity fundraiser in the Last Year:   . Arboriculturist in the Last Year:   Transportation Needs:   . Film/video editor (Medical):   Marland Kitchen Lack of Transportation (Non-Medical):   Physical Activity:   . Days of Exercise per Week:   . Minutes of Exercise per Session:   Stress:   . Feeling of Stress :   Social Connections:   . Frequency of Communication with Friends and Family:   . Frequency of Social Gatherings with Friends and Family:   . Attends Religious Services:   . Active Member of Clubs or Organizations:   . Attends Archivist Meetings:   Marland Kitchen Marital Status:      Family History: The patient's family history includes Asthma in his father; Emphysema in his father. There is no history of Pancreatic cancer, Throat cancer, Stomach cancer, Heart disease, Diabetes, Kidney disease, Liver disease, or Colon cancer. ROS:   Please see the history of present illness.    All 14 point review of systems negative except as described per history of present illness  EKGs/Labs/Other Studies Reviewed:      Recent Labs: 07/12/2019: ALT 19; BUN 17; Creatinine, Ser 1.25; Hemoglobin 13.5; Platelets 219; Potassium 3.8; Sodium 139  Recent Lipid Panel No results found for: CHOL, TRIG, HDL, CHOLHDL, VLDL, LDLCALC, LDLDIRECT  Physical Exam:    VS:  BP (!) 126/92 (BP Location: Left Arm, Patient Position: Sitting, Cuff Size: Normal)   Pulse 77   Ht 6\' 1"  (1.854 m)   Wt 200 lb 12.8 oz (91.1 kg)   SpO2 97%   BMI 26.49 kg/m     Wt Readings from Last 3 Encounters:  10/11/19 200 lb 12.8 oz  (91.1 kg)  08/26/19 205 lb (93 kg)  05/27/19 208 lb 1.9 oz (94.4 kg)     GEN:  Well nourished, well developed in no acute distress HEENT: Normal NECK: No JVD; No carotid bruits LYMPHATICS: No lymphadenopathy CARDIAC: RRR, no murmurs, no rubs, no gallops RESPIRATORY:  Clear to auscultation without rales, wheezing or rhonchi  ABDOMEN: Soft, non-tender, non-distended MUSCULOSKELETAL:  No edema; No deformity  SKIN: Warm and dry  LOWER EXTREMITIES: no swelling NEUROLOGIC:  Alert and oriented x 3 PSYCHIATRIC:  Normal affect   ASSESSMENT:    1. H/O hypertrophic cardiomyopathy   2. Essential hypertension   3. Ascending aortic aneurysm (HCC) 4.6 cm in summer 2019    PLAN:    In order of problems listed above:  1. Questionable history of hypertrophic cardiomyopathy MRI scheduled to clarify the diagnosis will wait for results of that. 2. Essential hypertension: Blood pressure seems to be well controlled without carvedilol.  He described episodes of passing out after the dose of carvedilol has been increased now that medication has been withdrawn.  3. Cardiomyopathy ejection fraction 4045% in August 2020 I will wait for results of MRI to check on ejection fraction to decide about therapy he may require it beta-blocker back. 4. History of ascending aortic aneurysm again will wait for MRI to reassess this.  I did review her extensive record for this visit   Medication Adjustments/Labs and Tests Ordered: Current medicines are reviewed at length with the patient today.  Concerns regarding medicines are outlined above.  No orders of the defined types were placed in this encounter.  Medication changes: No orders of the defined types were placed in this encounter.   Signed, Park Liter, MD, [facility] 10/11/2019 11:58 AM    [place]

## 2019-10-11 NOTE — Patient Instructions (Signed)

## 2019-10-31 DIAGNOSIS — G4733 Obstructive sleep apnea (adult) (pediatric): Secondary | ICD-10-CM | POA: Diagnosis not present

## 2019-11-04 ENCOUNTER — Ambulatory Visit: Payer: PPO | Admitting: Cardiology

## 2019-11-06 DIAGNOSIS — Z20828 Contact with and (suspected) exposure to other viral communicable diseases: Secondary | ICD-10-CM | POA: Diagnosis not present

## 2019-11-11 DIAGNOSIS — H2513 Age-related nuclear cataract, bilateral: Secondary | ICD-10-CM | POA: Diagnosis not present

## 2019-11-11 DIAGNOSIS — H40013 Open angle with borderline findings, low risk, bilateral: Secondary | ICD-10-CM | POA: Diagnosis not present

## 2019-11-11 DIAGNOSIS — H20011 Primary iridocyclitis, right eye: Secondary | ICD-10-CM | POA: Diagnosis not present

## 2019-11-15 ENCOUNTER — Telehealth (HOSPITAL_COMMUNITY): Payer: Self-pay | Admitting: *Deleted

## 2019-11-15 NOTE — Telephone Encounter (Signed)
Reaching out to patient to offer assistance regarding upcoming cardiac imaging study; pt verbalizes understanding of appt date/time, parking situation and where to check in, and verified current allergies; name and call back number provided for further questions should they arise  [facility] [phone] office [phone] cell

## 2019-11-18 ENCOUNTER — Other Ambulatory Visit: Payer: Self-pay

## 2019-11-18 ENCOUNTER — Ambulatory Visit (HOSPITAL_COMMUNITY)
Admission: RE | Admit: 2019-11-18 | Discharge: 2019-11-18 | Disposition: A | Discharge status: Home / Self Care | Payer: PPO | Source: Ambulatory Visit | Attending: Cardiology | Admitting: Cardiology

## 2019-11-18 DIAGNOSIS — R0989 Other specified symptoms and signs involving the circulatory and respiratory systems: Secondary | ICD-10-CM | POA: Insufficient documentation

## 2019-11-18 DIAGNOSIS — R42 Dizziness and giddiness: Secondary | ICD-10-CM | POA: Diagnosis not present

## 2019-11-18 DIAGNOSIS — R55 Syncope and collapse: Secondary | ICD-10-CM

## 2019-11-18 DIAGNOSIS — I10 Essential (primary) hypertension: Secondary | ICD-10-CM | POA: Diagnosis not present

## 2019-11-18 DIAGNOSIS — R931 Abnormal findings on diagnostic imaging of heart and coronary circulation: Secondary | ICD-10-CM

## 2019-11-18 MED ORDER — GADOBUTROL 1 MMOL/ML IV SOLN
10.0000 mL | Freq: Once | INTRAVENOUS | Status: AC | PRN
  Administered 2019-11-18: 10 mL via INTRAVENOUS

## 2019-11-27 ENCOUNTER — Ambulatory Visit: Payer: PPO | Admitting: Neurology

## 2019-12-04 ENCOUNTER — Ambulatory Visit: Payer: PPO | Admitting: Cardiology

## 2019-12-04 ENCOUNTER — Encounter: Payer: Self-pay | Admitting: Cardiology

## 2019-12-04 ENCOUNTER — Telehealth: Payer: Self-pay

## 2019-12-04 ENCOUNTER — Other Ambulatory Visit: Payer: Self-pay

## 2019-12-04 VITALS — BP 130/88 | HR 96 | Ht 73.0 in | Wt 196.0 lb

## 2019-12-04 DIAGNOSIS — Z8679 Personal history of other diseases of the circulatory system: Secondary | ICD-10-CM

## 2019-12-04 DIAGNOSIS — I712 Thoracic aortic aneurysm, without rupture: Secondary | ICD-10-CM | POA: Diagnosis not present

## 2019-12-04 DIAGNOSIS — E785 Hyperlipidemia, unspecified: Secondary | ICD-10-CM

## 2019-12-04 DIAGNOSIS — I10 Essential (primary) hypertension: Secondary | ICD-10-CM

## 2019-12-04 DIAGNOSIS — I7121 Aneurysm of the ascending aorta, without rupture: Secondary | ICD-10-CM

## 2019-12-04 HISTORY — DX: Hyperlipidemia, unspecified: E78.5

## 2019-12-04 MED ORDER — AMLODIPINE BESYLATE 5 MG PO TABS: 5.0000 mg | ORAL_TABLET | Freq: Every day | ORAL | 1 refills | Status: DC

## 2019-12-04 NOTE — Telephone Encounter (Signed)
-----   Message from Berniece Salines, DO sent at 12/03/2019  9:08 AM EDT ----- Your cardiac MRI did not show evidence of hypertrophic cardiomyopathy.  You do have dilated ascending aorta 44 mm.  We can discuss in details at your next visit with Dr. Agustin Cree.

## 2019-12-04 NOTE — Progress Notes (Signed)
Cardiology Office Note:    Date:  12/04/2019   ID:  Ricardo Morris, DOB 05/21/1950, MRN [id number]  PCP:  Sueanne Margarita, DO  Cardiologist:  Jenne Campus, MD    Referring MD: Sueanne Margarita, DO   Chief Complaint  Patient presents with  . Follow-up  I am doing fine  History of Present Illness:    Ricardo Morris is a 69 y.o. male with past medical history significant for ascending aortic aneurysm, questionable history of hypertrophic cardiomyopathy, essential hypertension, recently recognized dyslipidemia.  Comes today to my office discuss results of his MRI.  Previously he did have echocardiogram done which showed diminished left ventricle ejection fraction 45%, also wall thickness was assessed as 50 mm with suspicion for hypertrophic cardiomyopathy.  After that he had MRI done which showed wall thickness only 12 mm, which I think is related to hypertension that he has, also left ventricle ejection fraction is normal.  Another complaint that he had before is the fact that he passed out however it looks vagal.  Overall he is doing well denies have any chest pain tightness squeezing pressure burning chest.  He said that he is not too active and obviously that need to be changed.  Described 1 episode of palpitation the morning lasting for about 10 seconds but otherwise seems to be doing well  Past Medical History:  Diagnosis Date  . ALLERGIC RHINITIS 05/31/2009   Qualifier: Diagnosis of  By: Annamaria Boots MD, Clinton D   . Anxiety   . Ascending aortic aneurysm (HCC) 4.6 cm in summer 2019 11/19/2018  . Colon polyp   . Diverticulosis of colon without hemorrhage 02/21/2013  . Dyspepsia and other specified disorders of function of stomach 04/18/2013  . Essential hypertension 05/27/2019  . GERD (gastroesophageal reflux disease)   . H/O hypertrophic cardiomyopathy 11/19/2018   No obstruction, in the summer 2018 Wall thickness was 15 mm  . Heart abnormality    slight enlarged arota- no change since previous  ekg  . History of kidney stones   . Hypertension   . Hypertensive retinopathy of both eyes 06/19/2018  . INSOMNIA 05/26/2009   Qualifier: Diagnosis of  By: Annamaria Boots MD, Clinton D   . Kidney stones    x1  . Nuclear sclerotic cataract of both eyes 06/19/2018  . OSA (obstructive sleep apnea) 05/26/2009   S/p UPPP 2006 -208lbs - RDI 13/h corrected by CPAP 7 cm    . 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 Retinal edema 06/19/2018  . RHINITIS 05/26/2009   Qualifier: Diagnosis of  By: Annamaria Boots MD, Clinton D   . Sleep apnea    uses c-pap  . Unspecified constipation 07/01/2013    Past Surgical History:  Procedure Laterality Date  . APPENDECTOMY  1956  . COLONOSCOPY    . EXTRACORPOREAL SHOCK WAVE LITHOTRIPSY Left 01/25/2018   Procedure: EXTRACORPOREAL SHOCK WAVE LITHOTRIPSY (ESWL);  Surgeon: Ardis Hughs, MD;  Location: [facility];  Service: Urology;  Laterality: Left;  . NASAL SINUS SURGERY    . tonsillectomy      Current Medications: Current Meds  Medication Sig  . amitriptyline (ELAVIL) 10 MG tablet Take 10 mg by mouth at bedtime.   Marland Kitchen amLODipine (NORVASC) 2.5 MG tablet Take 1 tablet (2.5 mg total) by mouth daily.  Marland Kitchen loratadine (CLARITIN) 10 MG tablet Take 10 mg by mouth daily as needed.  Marland Kitchen losartan (COZAAR) 50 MG tablet Take 50  mg by mouth daily.  . naproxen sodium (ALEVE) 220 MG tablet Take 220 mg by mouth daily as needed.  . pantoprazole (PROTONIX) 40 MG tablet Take 40 mg by mouth daily.   . triazolam (HALCION) 0.25 MG tablet Take 0.25 mg by mouth at bedtime and may repeat dose one time if needed.  . valACYclovir (VALTREX) 1000 MG tablet Take 1 tablet by mouth daily.     Allergies:   Penicillins   Social History   Socioeconomic History  . Marital status: Single    Spouse name: Not on file  . Number of children: 1  . Years of education: Not on file  . Highest education level: Not on file  Occupational  History  . Occupation: Engineer, production  Tobacco Use  . Smoking status: Never Smoker  . Smokeless tobacco: Never Used  Vaping Use  . Vaping Use: Never used  Substance and Sexual Activity  . Alcohol use: No  . Drug use: No  . Sexual activity: Not on file  Other Topics Concern  . Not on file  Social History Narrative  . Not on file   Social Determinants of Health   Financial Resource Strain:   . Difficulty of Paying Living Expenses: Not on file  Food Insecurity:   . Worried About Charity fundraiser in the Last Year: Not on file  . Ran Out of Food in the Last Year: Not on file  Transportation Needs:   . Lack of Transportation (Medical): Not on file  . Lack of Transportation (Non-Medical): Not on file  Physical Activity:   . Days of Exercise per Week: Not on file  . Minutes of Exercise per Session: Not on file  Stress:   . Feeling of Stress : Not on file  Social Connections:   . Frequency of Communication with Friends and Family: Not on file  . Frequency of Social Gatherings with Friends and Family: Not on file  . Attends Religious Services: Not on file  . Active Member of Clubs or Organizations: Not on file  . Attends Archivist Meetings: Not on file  . Marital Status: Not on file     Family History: The patient's family history includes Asthma in his father; Emphysema in his father. There is no history of Pancreatic cancer, Throat cancer, Stomach cancer, Heart disease, Diabetes, Kidney disease, Liver disease, or Colon cancer. ROS:   Please see the history of present illness.    All 14 point review of systems negative except as described per history of present illness  EKGs/Labs/Other Studies Reviewed:      Recent Labs: 07/12/2019: ALT 19; BUN 17; Creatinine, Ser 1.25; Hemoglobin 13.5; Platelets 219; Potassium 3.8; Sodium 139  Recent Lipid Panel No results found for: CHOL, TRIG, HDL, CHOLHDL, VLDL, LDLCALC, LDLDIRECT  Physical Exam:    VS:  BP 130/88 (BP  Location: Left Arm, Patient Position: Sitting, Cuff Size: Normal)   Pulse 96   Ht 6\' 1"  (1.854 m)   Wt 196 lb (88.9 kg)   SpO2 98%   BMI 25.86 kg/m     Wt Readings from Last 3 Encounters:  12/04/19 196 lb (88.9 kg)  10/11/19 200 lb 12.8 oz (91.1 kg)  08/26/19 205 lb (93 kg)     GEN:  Well nourished, well developed in no acute distress HEENT: Normal NECK: No JVD; No carotid bruits LYMPHATICS: No lymphadenopathy CARDIAC: RRR, no murmurs, no rubs, no gallops RESPIRATORY:  Clear to auscultation without rales, wheezing  or rhonchi  ABDOMEN: Soft, non-tender, non-distended MUSCULOSKELETAL:  No edema; No deformity  SKIN: Warm and dry LOWER EXTREMITIES: no swelling NEUROLOGIC:  Alert and oriented x 3 PSYCHIATRIC:  Normal affect   ASSESSMENT:    1. Essential hypertension   2. Ascending aortic aneurysm (HCC) 4.6 cm in summer 2019   3. H/O hypertrophic cardiomyopathy   4. Dyslipidemia    PLAN:    In order of problems listed above:  1. Essential hypertension his blood pressure is still slightly elevated today.  I will double the dose of amlodipine.  He is taking 2.5 mg we will go to 5 mg.  On top of that he brought blood pressure measurements from home and blood pressure is always elevated.  We checked the accuracy of the device slightly over exaggerating. 2. Ascending octagon was of course 20 considering risk factors modifications, will increase dose of amlodipine to 5 mg, he need to have follow-up yearly. 3. Hypertrophic cardiomyopathy, I think this is left ventricle hypertrophy secondary of hypertension.  MRI reviewed with the patient shows structurally normal heart with wall thickness of 1.2.  There was no late gadolinium enhancement. 4. Dyslipidemia: I calculate his risk today for coronary events within the next 10 years and risk him as high, 21.4%.  Therefore, we will recheck his fasting lipid profile today and then most likely will start his cholesterol medication.  He need to be  on moderate intensity statin.   Medication Adjustments/Labs and Tests Ordered: Current medicines are reviewed at length with the patient today.  Concerns regarding medicines are outlined above.  No orders of the defined types were placed in this encounter.  Medication changes: No orders of the defined types were placed in this encounter.   Signed, Park Liter, MD, [facility] 12/04/2019 9:27 AM    [place]

## 2019-12-04 NOTE — Patient Instructions (Signed)
Medication Instructions:  Your physician has recommended you make the following change in your medication:   INCREASE: Norvasc to 5 mg daily  *If you need a refill on your cardiac medications before your next appointment, please call your pharmacy*   Lab Work: Your physician recommends that you return for lab work today: lipid   If you have labs (blood work) drawn today and your tests are completely normal, you will receive your results only by: Marland Kitchen MyChart Message (if you have MyChart) OR . A paper copy in the mail If you have any lab test that is abnormal or we need to change your treatment, we will call you to review the results.   Testing/Procedures: None.    Follow-Up: At [facility], you and your health needs are our priority.  As part of our continuing mission to provide you with exceptional heart care, we have created designated Provider Care Teams.  These Care Teams include your primary Cardiologist (physician) and Advanced Practice Providers (APPs -  Physician Assistants and Nurse Practitioners) who all work together to provide you with the care you need, when you need it.  We recommend signing up for the patient portal called "MyChart".  Sign up information is provided on this After Visit Summary.  MyChart is used to connect with patients for Virtual Visits (Telemedicine).  Patients are able to view lab/test results, encounter notes, upcoming appointments, etc.  Non-urgent messages can be sent to your provider as well.   To learn more about what you can do with MyChart, go to [email or web address].    Your next appointment:   5 month(s)  The format for your next appointment:   In Person  Provider:   Jenne Campus, MD   Other Instructions

## 2019-12-04 NOTE — Telephone Encounter (Signed)
Spoke with patient regarding results and recommendation.  Patient verbalizes understanding and is agreeable to plan of care. Advised patient to call back with any issues or concerns.  

## 2019-12-04 NOTE — Addendum Note (Signed)
Addended by: Ashok Norris on: 12/04/2019 09:34 AM   Modules accepted: Orders

## 2019-12-05 LAB — LIPID PANEL
Chol/HDL Ratio: 5.3 ratio — ABNORMAL HIGH (ref 0.0–5.0)
Cholesterol, Total: 210 mg/dL — ABNORMAL HIGH (ref 100–199)
HDL: 40 mg/dL (ref >39)
LDL Chol Calc (NIH): 142 mg/dL — ABNORMAL HIGH (ref 0–99)
Triglycerides: 153 mg/dL — ABNORMAL HIGH (ref 0–149)
VLDL Cholesterol Cal: 28 mg/dL (ref 5–40)

## 2019-12-11 ENCOUNTER — Telehealth: Payer: Self-pay | Admitting: Emergency Medicine

## 2019-12-11 DIAGNOSIS — E785 Hyperlipidemia, unspecified: Secondary | ICD-10-CM

## 2019-12-11 MED ORDER — ATORVASTATIN CALCIUM 10 MG PO TABS: 10.0000 mg | ORAL_TABLET | Freq: Every day | ORAL | 1 refills | Status: DC

## 2019-12-11 NOTE — Telephone Encounter (Signed)
-----   Message from Park Liter, MD sent at 12/11/2019 11:16 AM EDT ----- His cholesterol is elevated need to be treated, I suggest to start 10 mg of Lipitor daily, fasting lipid profile need to be repeated within next 6 weeks

## 2019-12-11 NOTE — Telephone Encounter (Signed)
Called patient informed him per Dr. Agustin Cree of lab results and to start lipitor 10 mg daily. He verbally understood and will have fasting lab work done in 6 weeks. No further questions.

## 2020-02-07 DIAGNOSIS — Z125 Encounter for screening for malignant neoplasm of prostate: Secondary | ICD-10-CM | POA: Diagnosis not present

## 2020-02-07 DIAGNOSIS — E785 Hyperlipidemia, unspecified: Secondary | ICD-10-CM | POA: Diagnosis not present

## 2020-02-14 ENCOUNTER — Telehealth: Payer: Self-pay | Admitting: Cardiology

## 2020-02-14 DIAGNOSIS — H9312 Tinnitus, left ear: Secondary | ICD-10-CM | POA: Diagnosis not present

## 2020-02-14 DIAGNOSIS — R972 Elevated prostate specific antigen [PSA]: Secondary | ICD-10-CM | POA: Diagnosis not present

## 2020-02-14 DIAGNOSIS — G4733 Obstructive sleep apnea (adult) (pediatric): Secondary | ICD-10-CM | POA: Diagnosis not present

## 2020-02-14 DIAGNOSIS — K219 Gastro-esophageal reflux disease without esophagitis: Secondary | ICD-10-CM | POA: Diagnosis not present

## 2020-02-14 DIAGNOSIS — I712 Thoracic aortic aneurysm, without rupture: Secondary | ICD-10-CM | POA: Diagnosis not present

## 2020-02-14 DIAGNOSIS — Z0000 Encounter for general adult medical examination without abnormal findings: Secondary | ICD-10-CM | POA: Diagnosis not present

## 2020-02-14 DIAGNOSIS — R82998 Other abnormal findings in urine: Secondary | ICD-10-CM | POA: Diagnosis not present

## 2020-02-14 DIAGNOSIS — E875 Hyperkalemia: Secondary | ICD-10-CM | POA: Diagnosis not present

## 2020-02-14 DIAGNOSIS — I10 Essential (primary) hypertension: Secondary | ICD-10-CM | POA: Diagnosis not present

## 2020-02-14 DIAGNOSIS — E785 Hyperlipidemia, unspecified: Secondary | ICD-10-CM | POA: Diagnosis not present

## 2020-02-14 NOTE — Telephone Encounter (Signed)
° ° °  Pt said had his physical wit pcp and they sent blood work result today to Dr. Agustin Cree

## 2020-02-25 DIAGNOSIS — Z1212 Encounter for screening for malignant neoplasm of rectum: Secondary | ICD-10-CM | POA: Diagnosis not present

## 2020-03-09 NOTE — Telephone Encounter (Signed)
Patient calling to find out if the lab work was received.

## 2020-03-10 ENCOUNTER — Other Ambulatory Visit: Payer: Self-pay | Admitting: Cardiology

## 2020-03-10 DIAGNOSIS — I10 Essential (primary) hypertension: Secondary | ICD-10-CM | POA: Diagnosis not present

## 2020-03-10 DIAGNOSIS — Z23 Encounter for immunization: Secondary | ICD-10-CM | POA: Diagnosis not present

## 2020-03-10 DIAGNOSIS — R972 Elevated prostate specific antigen [PSA]: Secondary | ICD-10-CM | POA: Diagnosis not present

## 2020-03-10 NOTE — Telephone Encounter (Signed)
Why exactly in my reviewing the labs done by primary care physician?  I see some evidence of hypothyroidism and that need to be addressed by primary care physician.  I do see mild elevation of cholesterol but that could be related to hypothyroidism

## 2020-03-10 NOTE — Telephone Encounter (Signed)
Refill sent to pharmacy.   

## 2020-04-09 DIAGNOSIS — D2261 Melanocytic nevi of right upper limb, including shoulder: Secondary | ICD-10-CM | POA: Diagnosis not present

## 2020-04-09 DIAGNOSIS — L821 Other seborrheic keratosis: Secondary | ICD-10-CM | POA: Diagnosis not present

## 2020-04-09 DIAGNOSIS — D2262 Melanocytic nevi of left upper limb, including shoulder: Secondary | ICD-10-CM | POA: Diagnosis not present

## 2020-04-09 DIAGNOSIS — D1801 Hemangioma of skin and subcutaneous tissue: Secondary | ICD-10-CM | POA: Diagnosis not present

## 2020-04-09 DIAGNOSIS — L218 Other seborrheic dermatitis: Secondary | ICD-10-CM | POA: Diagnosis not present

## 2020-04-09 DIAGNOSIS — D2361 Other benign neoplasm of skin of right upper limb, including shoulder: Secondary | ICD-10-CM | POA: Diagnosis not present

## 2020-04-09 DIAGNOSIS — D485 Neoplasm of uncertain behavior of skin: Secondary | ICD-10-CM | POA: Diagnosis not present

## 2020-04-22 DIAGNOSIS — G4733 Obstructive sleep apnea (adult) (pediatric): Secondary | ICD-10-CM | POA: Diagnosis not present

## 2020-05-01 DIAGNOSIS — F419 Anxiety disorder, unspecified: Secondary | ICD-10-CM | POA: Insufficient documentation

## 2020-05-01 DIAGNOSIS — G4730 Sleep apnea, unspecified: Secondary | ICD-10-CM | POA: Insufficient documentation

## 2020-05-01 DIAGNOSIS — I10 Essential (primary) hypertension: Secondary | ICD-10-CM | POA: Insufficient documentation

## 2020-05-01 DIAGNOSIS — K219 Gastro-esophageal reflux disease without esophagitis: Secondary | ICD-10-CM | POA: Insufficient documentation

## 2020-05-01 DIAGNOSIS — Z87442 Personal history of urinary calculi: Secondary | ICD-10-CM | POA: Insufficient documentation

## 2020-05-01 DIAGNOSIS — K635 Polyp of colon: Secondary | ICD-10-CM | POA: Insufficient documentation

## 2020-05-01 DIAGNOSIS — Q249 Congenital malformation of heart, unspecified: Secondary | ICD-10-CM | POA: Insufficient documentation

## 2020-05-01 DIAGNOSIS — N200 Calculus of kidney: Secondary | ICD-10-CM | POA: Insufficient documentation

## 2020-05-05 ENCOUNTER — Encounter: Payer: Self-pay | Admitting: Cardiology

## 2020-05-05 ENCOUNTER — Ambulatory Visit: Payer: PPO | Admitting: Cardiology

## 2020-05-05 ENCOUNTER — Other Ambulatory Visit: Payer: Self-pay

## 2020-05-05 VITALS — BP 120/82 | HR 83 | Ht 73.0 in | Wt 199.0 lb

## 2020-05-05 DIAGNOSIS — I7121 Aneurysm of the ascending aorta, without rupture: Secondary | ICD-10-CM

## 2020-05-05 DIAGNOSIS — I10 Essential (primary) hypertension: Secondary | ICD-10-CM | POA: Diagnosis not present

## 2020-05-05 DIAGNOSIS — I712 Thoracic aortic aneurysm, without rupture: Secondary | ICD-10-CM | POA: Diagnosis not present

## 2020-05-05 DIAGNOSIS — E785 Hyperlipidemia, unspecified: Secondary | ICD-10-CM

## 2020-05-05 DIAGNOSIS — Z8679 Personal history of other diseases of the circulatory system: Secondary | ICD-10-CM | POA: Diagnosis not present

## 2020-05-05 NOTE — Addendum Note (Signed)
Addended by: Senaida Ores on: 05/05/2020 01:46 PM   Modules accepted: Orders

## 2020-05-05 NOTE — Patient Instructions (Signed)

## 2020-05-05 NOTE — Progress Notes (Signed)
Cardiology Office Note:    Date:  05/05/2020   ID:  Ricardo Morris, DOB 05/04/1950, MRN [id number]  PCP:  Sueanne Margarita, DO  Cardiologist:  Jenne Campus, MD    Referring MD: Sueanne Margarita, DO   Chief Complaint  Patient presents with  . Follow-up    Lab results    History of Present Illness:    Ricardo Morris is a 70 y.o. male with past medical history significant for enlargement of the ascending aorta measuring 46 mm September 2019, obstructive sleep apnea, essential hypertension, there was some suspicion for hypertrophic cardiomyopathy however MRI showed normal structurally heart with thickness of the wall of 12 mm which is probably related to hypertension.  She also does have some hypertensive retinopathy so clearly he does have probably blood pressure.  Comes today to my office for follow-up.  Overall seems to be doing fine denies have any chest pain tightness squeezing pressure burning chest.  Try to be active walk on the regular basis but does not do any structured exercise program.  He described that he is kind of clumsy he said sometimes he for some mishaps including quick down can figure off balance.  No syncope no dizziness.  Past Medical History:  Diagnosis Date  . ALLERGIC RHINITIS 05/31/2009   Qualifier: Diagnosis of  By: Annamaria Boots MD, Clinton D   . Anxiety   . Ascending aortic aneurysm (HCC) 4.6 cm in summer 2019 11/19/2018  . Colon polyp   . Diverticulosis of colon without hemorrhage 02/21/2013  . Dyslipidemia 12/04/2019  . Dyspepsia and other specified disorders of function of stomach 04/18/2013  . Essential hypertension 05/27/2019  . GERD (gastroesophageal reflux disease)   . H/O hypertrophic cardiomyopathy 11/19/2018   No obstruction, in the summer 2018 Wall thickness was 15 mm  . Heart abnormality    slight enlarged arota- no change since previous ekg  . History of kidney stones   . Hypertension   . Hypertensive retinopathy of both eyes 06/19/2018  . INSOMNIA 05/26/2009    Qualifier: Diagnosis of  By: Annamaria Boots MD, Clinton D   . Kidney stones    x1  . Nuclear sclerotic cataract of both eyes 06/19/2018  . OSA (obstructive sleep apnea) 05/26/2009   S/p UPPP 2006 -208lbs - RDI 13/h corrected by CPAP 7 cm    . 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 Retinal edema 06/19/2018  . RHINITIS 05/26/2009   Qualifier: Diagnosis of  By: Annamaria Boots MD, Clinton D   . Sleep apnea    uses c-pap  . Unspecified constipation 07/01/2013    Past Surgical History:  Procedure Laterality Date  . APPENDECTOMY  1956  . COLONOSCOPY    . EXTRACORPOREAL SHOCK WAVE LITHOTRIPSY Left 01/25/2018   Procedure: EXTRACORPOREAL SHOCK WAVE LITHOTRIPSY (ESWL);  Surgeon: Ardis Hughs, MD;  Location: [facility];  Service: Urology;  Laterality: Left;  . NASAL SINUS SURGERY    . tonsillectomy      Current Medications: Current Meds  Medication Sig  . amitriptyline (ELAVIL) 10 MG tablet Take 10 mg by mouth at bedtime.   Marland Kitchen amLODipine (NORVASC) 5 MG tablet TAKE 1 TABLET(5 MG) BY MOUTH DAILY  . atorvastatin (LIPITOR) 10 MG tablet Take 1 tablet (10 mg total) by mouth daily. (Patient taking differently: Take 20 mg by mouth daily.)  . loratadine (CLARITIN) 10 MG tablet Take 10 mg by mouth daily as needed.  Marland Kitchen losartan (COZAAR)  50 MG tablet Take 50 mg by mouth daily.  . pantoprazole (PROTONIX) 40 MG tablet Take 40 mg by mouth daily.   . triazolam (HALCION) 0.25 MG tablet Take 0.25 mg by mouth at bedtime and may repeat dose one time if needed.  . valACYclovir (VALTREX) 1000 MG tablet Take 1 tablet by mouth daily.     Allergies:   Penicillins   Social History   Socioeconomic History  . Marital status: Single    Spouse name: Not on file  . Number of children: 1  . Years of education: Not on file  . Highest education level: Not on file  Occupational History  . Occupation: Engineer, production  Tobacco Use  . Smoking status: Never  Smoker  . Smokeless tobacco: Never Used  Vaping Use  . Vaping Use: Never used  Substance and Sexual Activity  . Alcohol use: No  . Drug use: No  . Sexual activity: Not on file  Other Topics Concern  . Not on file  Social History Narrative  . Not on file   Social Determinants of Health   Financial Resource Strain: Not on file  Food Insecurity: Not on file  Transportation Needs: Not on file  Physical Activity: Not on file  Stress: Not on file  Social Connections: Not on file     Family History: The patient's family history includes Asthma in his father; Emphysema in his father. There is no history of Pancreatic cancer, Throat cancer, Stomach cancer, Heart disease, Diabetes, Kidney disease, Liver disease, or Colon cancer. ROS:   Please see the history of present illness.    All 14 point review of systems negative except as described per history of present illness  EKGs/Labs/Other Studies Reviewed:      Recent Labs: 07/12/2019: ALT 19; BUN 17; Creatinine, Ser 1.25; Hemoglobin 13.5; Platelets 219; Potassium 3.8; Sodium 139  Recent Lipid Panel    Component Value Date/Time   CHOL 210 (H) 12/04/2019 0946   TRIG 153 (H) 12/04/2019 0946   HDL 40 12/04/2019 0946   CHOLHDL 5.3 (H) 12/04/2019 0946   LDLCALC 142 (H) 12/04/2019 0946    Physical Exam:    VS:  BP (!) 148/72 (BP Location: Right Arm, Patient Position: Sitting)   Pulse 83   Ht 6\' 1"  (1.854 m)   Wt 199 lb (90.3 kg)   SpO2 99%   BMI 26.25 kg/m     Wt Readings from Last 3 Encounters:  05/05/20 199 lb (90.3 kg)  12/04/19 196 lb (88.9 kg)  10/11/19 200 lb 12.8 oz (91.1 kg)     GEN:  Well nourished, well developed in no acute distress HEENT: Normal NECK: No JVD; No carotid bruits LYMPHATICS: No lymphadenopathy CARDIAC: RRR, no murmurs, no rubs, no gallops RESPIRATORY:  Clear to auscultation without rales, wheezing or rhonchi  ABDOMEN: Soft, non-tender, non-distended MUSCULOSKELETAL:  No edema; No deformity   SKIN: Warm and dry LOWER EXTREMITIES: no swelling NEUROLOGIC:  Alert and oriented x 3 PSYCHIATRIC:  Normal affect   ASSESSMENT:    1. Ascending aortic aneurysm (HCC) 4.6 cm in summer 2019   2. Essential hypertension   3. Dyslipidemia   4. H/O hypertrophic cardiomyopathy    PLAN:    In order of problems listed above:  1. Ascending aortic aneurysm measuring 4.6 mm in September 2019 we will make arrangements for another echocardiogram to recheck it. 2. Essential hypertension blood pressure slightly elevated today will check before he leaves the room.  He was  in to be upset he was have some phone call.  I asked him to check his blood pressure at home and send results to me. 3. Dyslipidemia.  He was on Lipitor 10 that was increased to 20 I do have cholesterol from his primary care physician from November which showed HDL 41 LDL of 85.  Patient is not sure if that was before increasing dose of Lipitor or after.  Therefore, I will schedule him to have another fasting lipid profile done. 4. We did talk about healthy lifestyle need to exercise on the regular basis which he will do   Medication Adjustments/Labs and Tests Ordered: Current medicines are reviewed at length with the patient today.  Concerns regarding medicines are outlined above.  No orders of the defined types were placed in this encounter.  Medication changes: No orders of the defined types were placed in this encounter.   Signed, Park Liter, MD, [facility] 05/05/2020 1:42 PM    [place]

## 2020-05-11 ENCOUNTER — Telehealth: Payer: Self-pay | Admitting: Cardiology

## 2020-05-11 ENCOUNTER — Ambulatory Visit: Payer: PPO | Admitting: Cardiology

## 2020-05-11 DIAGNOSIS — R351 Nocturia: Secondary | ICD-10-CM

## 2020-05-11 NOTE — Telephone Encounter (Signed)
Patient states he would like to have his PSA checked since he will have to get his lipid panel done. He would like to know if he can have this done at the same time.

## 2020-05-11 NOTE — Telephone Encounter (Signed)
Called patient informed him that Dr. Agustin Cree is out this week but when he returns I will check with him on this he verbally understood no further questions.

## 2020-05-12 NOTE — Telephone Encounter (Signed)
Called patient informed him we will check psa.

## 2020-05-12 NOTE — Telephone Encounter (Signed)
Should be fine to do PSA with his fasting lipid profile

## 2020-05-18 DIAGNOSIS — I10 Essential (primary) hypertension: Secondary | ICD-10-CM | POA: Diagnosis not present

## 2020-05-18 DIAGNOSIS — I712 Thoracic aortic aneurysm, without rupture: Secondary | ICD-10-CM | POA: Diagnosis not present

## 2020-05-18 DIAGNOSIS — Z8679 Personal history of other diseases of the circulatory system: Secondary | ICD-10-CM | POA: Diagnosis not present

## 2020-05-18 DIAGNOSIS — E785 Hyperlipidemia, unspecified: Secondary | ICD-10-CM | POA: Diagnosis not present

## 2020-05-19 LAB — LIPID PANEL
Chol/HDL Ratio: 3.4 ratio (ref 0.0–5.0)
Cholesterol, Total: 140 mg/dL (ref 100–199)
HDL: 41 mg/dL (ref >39)
LDL Chol Calc (NIH): 81 mg/dL (ref 0–99)
Triglycerides: 94 mg/dL (ref 0–149)
VLDL Cholesterol Cal: 18 mg/dL (ref 5–40)

## 2020-08-17 DIAGNOSIS — G2581 Restless legs syndrome: Secondary | ICD-10-CM | POA: Diagnosis not present

## 2020-08-17 DIAGNOSIS — H40013 Open angle with borderline findings, low risk, bilateral: Secondary | ICD-10-CM | POA: Diagnosis not present

## 2020-08-17 DIAGNOSIS — E663 Overweight: Secondary | ICD-10-CM | POA: Diagnosis not present

## 2020-08-17 DIAGNOSIS — R972 Elevated prostate specific antigen [PSA]: Secondary | ICD-10-CM | POA: Diagnosis not present

## 2020-08-17 DIAGNOSIS — E785 Hyperlipidemia, unspecified: Secondary | ICD-10-CM | POA: Diagnosis not present

## 2020-08-17 DIAGNOSIS — I10 Essential (primary) hypertension: Secondary | ICD-10-CM | POA: Diagnosis not present

## 2020-08-17 DIAGNOSIS — G4700 Insomnia, unspecified: Secondary | ICD-10-CM | POA: Diagnosis not present

## 2020-08-17 DIAGNOSIS — H2513 Age-related nuclear cataract, bilateral: Secondary | ICD-10-CM | POA: Diagnosis not present

## 2020-08-17 DIAGNOSIS — R55 Syncope and collapse: Secondary | ICD-10-CM | POA: Diagnosis not present

## 2020-08-17 DIAGNOSIS — H20011 Primary iridocyclitis, right eye: Secondary | ICD-10-CM | POA: Diagnosis not present

## 2020-08-17 DIAGNOSIS — I712 Thoracic aortic aneurysm, without rupture: Secondary | ICD-10-CM | POA: Diagnosis not present

## 2020-08-17 DIAGNOSIS — H9312 Tinnitus, left ear: Secondary | ICD-10-CM | POA: Diagnosis not present

## 2020-08-17 DIAGNOSIS — K219 Gastro-esophageal reflux disease without esophagitis: Secondary | ICD-10-CM | POA: Diagnosis not present

## 2020-08-17 DIAGNOSIS — G4733 Obstructive sleep apnea (adult) (pediatric): Secondary | ICD-10-CM | POA: Diagnosis not present

## 2020-09-04 ENCOUNTER — Telehealth: Payer: Self-pay | Admitting: Cardiology

## 2020-09-04 NOTE — Telephone Encounter (Signed)
   Pt c/o BP issue: STAT if pt c/o blurred vision, one-sided weakness or slurred speech  1. What are your last 5 BP readings?  09/03/20 5 pm 114/75 74 6:19 pm 148/97 69 6:39 pm 129/85 62   2. Are you having any other symptoms (ex. Dizziness, headache, blurred vision, passed out)? Sweats,   3. What is your BP issue? Pt said yesterday evening, he suddenly feels weird and sweating so bad he feels that way lasted 15 mins. He checked his BP, he said he didn't feel any pain but it was a weird feeling. He would like to speak with Dr. Marthann Schiller nurse to discuss

## 2020-09-07 NOTE — Telephone Encounter (Signed)
Called patient. He has not had anymore episodes. Advised him per Dr. Kandis Mannan not to take viagra anymore and let us know if any of these symptoms return he understood no further questions.

## 2020-09-08 DIAGNOSIS — N5201 Erectile dysfunction due to arterial insufficiency: Secondary | ICD-10-CM | POA: Diagnosis not present

## 2020-09-16 DIAGNOSIS — G4733 Obstructive sleep apnea (adult) (pediatric): Secondary | ICD-10-CM | POA: Diagnosis not present

## 2020-09-28 DIAGNOSIS — H40013 Open angle with borderline findings, low risk, bilateral: Secondary | ICD-10-CM | POA: Diagnosis not present

## 2020-09-28 DIAGNOSIS — R972 Elevated prostate specific antigen [PSA]: Secondary | ICD-10-CM | POA: Diagnosis not present

## 2020-09-28 DIAGNOSIS — H20011 Primary iridocyclitis, right eye: Secondary | ICD-10-CM | POA: Diagnosis not present

## 2020-09-28 DIAGNOSIS — H2513 Age-related nuclear cataract, bilateral: Secondary | ICD-10-CM | POA: Diagnosis not present

## 2020-09-30 ENCOUNTER — Telehealth: Payer: Self-pay | Admitting: Pulmonary Disease

## 2020-09-30 NOTE — Telephone Encounter (Signed)
Left message for patient to call back  

## 2020-10-01 NOTE — Telephone Encounter (Signed)
Called and spoke to pt. Pt is questioning if he is able to do a televisit since his last OV was a televisit in 03/2019. Advised pt to schedule in person visit and then can see if the next visit can be virtual. Pt verbalized understanding and states he will call back to schedule OV as he doesn't have his calendar with him at the moment. Nothing further needed at this time.

## 2020-11-10 ENCOUNTER — Ambulatory Visit: Payer: PPO | Admitting: Cardiology

## 2020-11-13 ENCOUNTER — Other Ambulatory Visit: Payer: Self-pay

## 2020-11-13 MED ORDER — AMLODIPINE BESYLATE 5 MG PO TABS: 5.0000 mg | ORAL_TABLET | Freq: Every day | ORAL | 1 refills | Status: AC

## 2020-11-13 NOTE — Telephone Encounter (Signed)
Refill of Amlodipine 5 mg sent to [facility] in [place], [place].

## 2020-11-25 ENCOUNTER — Encounter: Payer: Self-pay | Admitting: Cardiology

## 2020-11-25 ENCOUNTER — Ambulatory Visit: Payer: PPO | Admitting: Cardiology

## 2020-11-25 ENCOUNTER — Other Ambulatory Visit: Payer: Self-pay

## 2020-11-25 ENCOUNTER — Ambulatory Visit: Payer: PPO | Admitting: Pulmonary Disease

## 2020-11-25 VITALS — BP 136/84 | HR 84 | Ht 72.0 in | Wt 195.0 lb

## 2020-11-25 DIAGNOSIS — G2581 Restless legs syndrome: Secondary | ICD-10-CM | POA: Insufficient documentation

## 2020-11-25 DIAGNOSIS — I10 Essential (primary) hypertension: Secondary | ICD-10-CM | POA: Diagnosis not present

## 2020-11-25 DIAGNOSIS — I712 Thoracic aortic aneurysm, without rupture: Secondary | ICD-10-CM | POA: Diagnosis not present

## 2020-11-25 DIAGNOSIS — E785 Hyperlipidemia, unspecified: Secondary | ICD-10-CM | POA: Insufficient documentation

## 2020-11-25 DIAGNOSIS — H9312 Tinnitus, left ear: Secondary | ICD-10-CM | POA: Insufficient documentation

## 2020-11-25 DIAGNOSIS — G4733 Obstructive sleep apnea (adult) (pediatric): Secondary | ICD-10-CM

## 2020-11-25 DIAGNOSIS — E875 Hyperkalemia: Secondary | ICD-10-CM | POA: Insufficient documentation

## 2020-11-25 DIAGNOSIS — I7121 Aneurysm of the ascending aorta, without rupture: Secondary | ICD-10-CM

## 2020-11-25 DIAGNOSIS — R972 Elevated prostate specific antigen [PSA]: Secondary | ICD-10-CM | POA: Insufficient documentation

## 2020-11-25 NOTE — Patient Instructions (Signed)
Medication Instructions:  Your physician recommends that you continue on your current medications as directed. Please refer to the Current Medication list given to you today.  *If you need a refill on your cardiac medications before your next appointment, please call your pharmacy*   Lab Work: None If you have labs (blood work) drawn today and your tests are completely normal, you will receive your results only by: [place] (if you have MyChart) OR A paper copy in the mail If you have any lab test that is abnormal or we need to change your treatment, we will call you to review the results.   Testing/Procedures: Your physician has requested that you have an echocardiogram. Echocardiography is a painless test that uses sound waves to create images of your heart. It provides your doctor with information about the size and shape of your heart and how well your heart's chambers and valves are working. This procedure takes approximately one hour. There are no restrictions for this procedure.    Follow-Up: At [facility], you and your health needs are our priority.  As part of our continuing mission to provide you with exceptional heart care, we have created designated Provider Care Teams.  These Care Teams include your primary Cardiologist (physician) and Advanced Practice Providers (APPs -  Physician Assistants and Nurse Practitioners) who all work together to provide you with the care you need, when you need it.  We recommend signing up for the patient portal called "MyChart".  Sign up information is provided on this After Visit Summary.  MyChart is used to connect with patients for Virtual Visits (Telemedicine).  Patients are able to view lab/test results, encounter notes, upcoming appointments, etc.  Non-urgent messages can be sent to your provider as well.   To learn more about what you can do with MyChart, go to [email or web address].    Your next appointment:   6  month(s)  The format for your next appointment:   In Person  Provider:   Jenne Campus, MD   Other Instructions Echocardiogram An echocardiogram is a test that uses sound waves (ultrasound) to produce images of the heart. Images from an echocardiogram can provide important information about: Heart size and shape. The size and thickness and movement of your heart's walls. Heart muscle function and strength. Heart valve function or if you have stenosis. Stenosis is when the heart valves are too narrow. If blood is flowing backward through the heart valves (regurgitation). A tumor or infectious growth around the heart valves. Areas of heart muscle that are not working well because of poor blood flow or injury from a heart attack. Aneurysm detection. An aneurysm is a weak or damaged part of an artery wall. The wall bulges out from the normal force of blood pumping through the body. Tell a health care provider about: Any allergies you have. All medicines you are taking, including vitamins, herbs, eye drops, creams, and over-the-counter medicines. Any blood disorders you have. Any surgeries you have had. Any medical conditions you have. Whether you are pregnant or may be pregnant. What are the risks? Generally, this is a safe test. However, problems may occur, including an allergic reaction to dye (contrast) that may be used during the test. What happens before the test? No specific preparation is needed. You may eat and drink normally. What happens during the test?  You will take off your clothes from the waist up and put on a hospital gown. Electrodes or electrocardiogram (ECG)patches may be placed on  your chest. The electrodes or patches are then connected to a device that monitors your heart rate and rhythm. You will lie down on a table for an ultrasound exam. A gel will be applied to your chest to help sound waves pass through your skin. A handheld device, called a transducer,  will be pressed against your chest and moved over your heart. The transducer produces sound waves that travel to your heart and bounce back (or "echo" back) to the transducer. These sound waves will be captured in real-time and changed into images of your heart that can be viewed on a video monitor. The images will be recorded on a computer and reviewed by your health care provider. You may be asked to change positions or hold your breath for a short time. This makes it easier to get different views or better views of your heart. In some cases, you may receive contrast through an IV in one of your veins. This can improve the quality of the pictures from your heart. The procedure may vary among health care providers and hospitals. What can I expect after the test? You may return to your normal, everyday life, including diet, activities, andmedicines, unless your health care provider tells you not to do that. Follow these instructions at home: It is up to you to get the results of your test. Ask your health care provider, or the department that is doing the test, when your results will be ready. Keep all follow-up visits. This is important. Summary An echocardiogram is a test that uses sound waves (ultrasound) to produce images of the heart. Images from an echocardiogram can provide important information about the size and shape of your heart, heart muscle function, heart valve function, and other possible heart problems. You do not need to do anything to prepare before this test. You may eat and drink normally. After the echocardiogram is completed, you may return to your normal, everyday life, unless your health care provider tells you not to do that. This information is not intended to replace advice given to you by your health care provider. Make sure you discuss any questions you have with your healthcare provider. Document Revised: 11/12/2019 Document Reviewed: 11/12/2019 Elsevier Patient  Education  2022 [employer].

## 2020-11-25 NOTE — Progress Notes (Signed)
Cardiology Office Note:    Date:  11/25/2020   ID:  Ricardo Morris, DOB Apr 30, 1950, MRN [id number]  PCP:  Ricardo Margarita, DO  Cardiologist:  Ricardo Campus, MD    Referring MD: Ricardo Margarita, DO   No chief complaint on file. I am doing well  History of Present Illness:    Ricardo Morris is a 70 y.o. male with past medical history significant for enlargement of the ascending aorta measuring 46 mm September 2019 however his CT of his chest with contrast done in August 2020 showed diameter of 45 mm, obstructive sleep apnea, essential hypertension.  Also got some questionable hypertrophic cardiomyopathy, however MRI done showed normal structural heart with mild thickening of the wall measuring 12 mm which is most likely related to hypertension. Is coming today to my office follow-up overall doing well.  Denies have any chest pain tightness squeezing pressure burning chest.  He is trying to be active trying to walk 3-4 times a week for about 2 miles.  Past Medical History:  Diagnosis Date   ALLERGIC RHINITIS 05/31/2009   Qualifier: Diagnosis of  By: Ricardo Boots MD, Ricardo Morris    Anxiety    Ascending aortic aneurysm ([place]) 4.6 cm in summer 2019 11/19/2018   Colon polyp    Diverticulosis of colon without hemorrhage 02/21/2013   Dyslipidemia 12/04/2019   Dyspepsia and other specified disorders of function of stomach 04/18/2013   Essential hypertension 05/27/2019   GERD (gastroesophageal reflux disease)    H/O hypertrophic cardiomyopathy 11/19/2018   No obstruction, in the summer 2018 Wall thickness was 15 mm   Heart abnormality    slight enlarged arota- no change since previous ekg   History of kidney stones    Hypertension    Hypertensive retinopathy of both eyes 06/19/2018   INSOMNIA 05/26/2009   Qualifier: Diagnosis of  By: Ricardo Boots MD, Ricardo Morris    Kidney stones    x1   Nuclear sclerotic cataract of both eyes 06/19/2018   OSA (obstructive sleep apnea) 05/26/2009   S/p UPPP 2006 -208lbs - RDI 13/h  corrected by CPAP 7 cm     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   Posterior synechiae of iris, 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   Retinal edema 06/19/2018   RHINITIS 05/26/2009   Qualifier: Diagnosis of  By: Ricardo Boots MD, Ricardo Morris    Sleep apnea    uses c-pap   Unspecified constipation 07/01/2013    Past Surgical History:  Procedure Laterality Date   APPENDECTOMY  1956   COLONOSCOPY     EXTRACORPOREAL SHOCK WAVE LITHOTRIPSY Left 01/25/2018   Procedure: EXTRACORPOREAL SHOCK WAVE LITHOTRIPSY (ESWL);  Surgeon: Ricardo Hughs, MD;  Location: [facility];  Service: Urology;  Laterality: Left;   NASAL SINUS SURGERY     tonsillectomy      Current Medications: No outpatient medications have been marked as taking for the 11/25/20 encounter (Appointment) with Park Liter, MD.     Allergies:   Penicillins   Social History   Socioeconomic History   Marital status: Single    Spouse name: Not on file   Number of children: 1   Years of education: Not on file   Highest education level: Not on file  Occupational History   Occupation: dental lab tech  Tobacco Use   Smoking status: Never   Smokeless tobacco: Never  Vaping Use   Vaping Use: Never used  Substance and Sexual Activity   Alcohol use: No   Drug  use: No   Sexual activity: Not on file  Other Topics Concern   Not on file  Social History Narrative   Not on file   Social Determinants of Health   Financial Resource Strain: Not on file  Food Insecurity: Not on file  Transportation Needs: Not on file  Physical Activity: Not on file  Stress: Not on file  Social Connections: Not on file     Family History: The patient's family history includes Asthma in his father; Emphysema in his father. There is no history of Pancreatic cancer, Throat cancer, Stomach cancer, Heart disease, Diabetes, Kidney disease, Liver disease, or Colon cancer. ROS:   Please see the history of present illness.    All 14 point review  of systems negative except as described per history of present illness  EKGs/Labs/Other Studies Reviewed:      Recent Labs: No results found for requested labs within last 8760 hours.  Recent Lipid Panel    Component Value Date/Time   CHOL 140 05/18/2020 0905   TRIG 94 05/18/2020 0905   HDL 41 05/18/2020 0905   CHOLHDL 3.4 05/18/2020 0905   LDLCALC 81 05/18/2020 0905    Physical Exam:    VS:  There were no vitals taken for this visit.    Wt Readings from Last 3 Encounters:  05/05/20 199 lb (90.3 kg)  12/04/19 196 lb (88.9 kg)  10/11/19 200 lb 12.8 oz (91.1 kg)     GEN:  Well nourished, well developed in no acute distress HEENT: Normal NECK: No JVD; No carotid bruits LYMPHATICS: No lymphadenopathy CARDIAC: RRR, no murmurs, no rubs, no gallops RESPIRATORY:  Clear to auscultation without rales, wheezing or rhonchi  ABDOMEN: Soft, non-tender, non-distended MUSCULOSKELETAL:  No edema; No deformity  SKIN: Warm and dry LOWER EXTREMITIES: no swelling NEUROLOGIC:  Alert and oriented x 3 PSYCHIATRIC:  Normal affect   ASSESSMENT:    1. Ascending aortic aneurysm ([facility]) 4.6 cm in summer 2019   2. Essential hypertension   3. Obstructive sleep apnea syndrome   4. Dyslipidemia    PLAN:    In order of problems listed above:  Ascending arctic aneurysm time to repeat his echocardiogram to recheck it which I will schedule him to have Essential hypertension she brought blood pressure measurements from home it is always good Obstructive sleep apnea that being managed by antimedicine team. Dyslipidemia did review his K PN which show LDL of 81 HDL 41 good cholesterol profile we will continue present management. He described to have palpitations he said he wakes up in the morning she feel it he does have apple watch that allowed him to record EKG I wanted him to wear a monitor for a week he does not want to increase that he waited before nothing happened therefore asked him to record  EKG when he have a symptomatology and send it to me.   Medication Adjustments/Labs and Tests Ordered: Current medicines are reviewed at length with the patient today.  Concerns regarding medicines are outlined above.  No orders of the defined types were placed in this encounter.  Medication changes: No orders of the defined types were placed in this encounter.   Signed, Park Liter, MD, [facility] 11/25/2020 1:10 PM    [place]

## 2020-11-25 NOTE — Addendum Note (Signed)
Addended by: Orvan July on: 11/25/2020 01:40 PM   Modules accepted: Orders

## 2020-11-27 ENCOUNTER — Ambulatory Visit: Payer: PPO | Admitting: Pulmonary Disease

## 2020-12-02 ENCOUNTER — Ambulatory Visit (HOSPITAL_BASED_OUTPATIENT_CLINIC_OR_DEPARTMENT_OTHER)
Admission: RE | Admit: 2020-12-02 | Discharge: 2020-12-02 | Disposition: A | Discharge status: Home / Self Care | Payer: PPO | Source: Ambulatory Visit | Attending: Cardiology | Admitting: Cardiology

## 2020-12-02 ENCOUNTER — Other Ambulatory Visit: Payer: Self-pay

## 2020-12-02 DIAGNOSIS — I712 Thoracic aortic aneurysm, without rupture: Secondary | ICD-10-CM | POA: Insufficient documentation

## 2020-12-02 DIAGNOSIS — I7121 Aneurysm of the ascending aorta, without rupture: Secondary | ICD-10-CM

## 2020-12-02 LAB — ECHOCARDIOGRAM COMPLETE
Area-P 1/2: 2.64 cm2
Calc EF: 56.6 %
P 1/2 time: 959 msec
S' Lateral: 3.94 cm
Single Plane A2C EF: 59.3 %
Single Plane A4C EF: 55.4 %

## 2020-12-03 ENCOUNTER — Ambulatory Visit: Payer: Medicare Other | Admitting: Pulmonary Disease

## 2020-12-03 ENCOUNTER — Encounter: Payer: Self-pay | Admitting: Pulmonary Disease

## 2020-12-03 VITALS — BP 134/76 | HR 70 | Temp 98.0°F | Ht 72.0 in | Wt 196.0 lb

## 2020-12-03 DIAGNOSIS — G4733 Obstructive sleep apnea (adult) (pediatric): Secondary | ICD-10-CM

## 2020-12-03 NOTE — Progress Notes (Signed)
   Subjective:    Patient ID: Ricardo Morris, male    DOB: [birth year], 70 y.o.   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  -new machine 12/2016   Chief Complaint  Patient presents with   Follow-up    Pt states follow up no concerns.    [place], change his insurance from [employer] to [employer].  He reports problems using his fullface mask, he has erythema over his face.  He has a mask leaking air into his eyes and making a mark over the bridge of his nose.  Due to this his compliance has decreased. He wonders if there are alternatives. CPAP download was reviewed which shows decreased compliance, good control of events on 7 cm  Significant tests/ events reviewed  PSG 2006 >>RDI of 13 events per hour corrected by CPAP of 7 cm with a full face mask and lowest desaturation of 89%  Review of Systems neg for any significant sore throat, dysphagia, itching, sneezing, nasal congestion or excess/ purulent secretions, fever, chills, sweats, unintended wt loss, pleuritic or exertional cp, hempoptysis, orthopnea pnd or change in chronic leg swelling. Also denies presyncope, palpitations, heartburn, abdominal pain, nausea, vomiting, diarrhea or change in bowel or urinary habits, dysuria,hematuria, rash, arthralgias, visual complaints, headache, numbness weakness or ataxia.     Objective:   Physical Exam  Gen. Pleasant, well-nourished, in no distress ENT - no thrush, no pallor/icterus,no post nasal drip, s/p UPPP Neck: No JVD, no thyromegaly, no carotid bruits Lungs: no use of accessory muscles, no dullness to percussion, clear without rales or rhonchi  Cardiovascular: Rhythm regular, heart sounds  normal, no murmurs or gallops, no peripheral edema Musculoskeletal: No deformities, no cyanosis or clubbing        Assessment & Plan:

## 2020-12-03 NOTE — Addendum Note (Signed)
Addended by: Fenton Foy on: 12/03/2020 12:26 PM   Modules accepted: Orders

## 2020-12-03 NOTE — Assessment & Plan Note (Signed)
His mask issues are causing decreased compliance For this, we suggested  Trial of airfit F 30 full face mask Trial of REMzzz mask liner from [employer]  It may be time to revisit degree of his sleep disordered breathing, I suggested a home sleep test which we will try to arrange and get approved from his insurance.  Would like to see if he has a positional component and if his OSA is still mild then perhaps we can discontinue CPAP altogether and use positional therapy instead

## 2020-12-03 NOTE — Patient Instructions (Signed)
  Schedule Home sleep test Trial of airfit F 30 full face mask Trial of REMzzz mask liner from [employer]

## 2020-12-19 DIAGNOSIS — G4733 Obstructive sleep apnea (adult) (pediatric): Secondary | ICD-10-CM | POA: Diagnosis not present

## 2021-01-18 DIAGNOSIS — G4733 Obstructive sleep apnea (adult) (pediatric): Secondary | ICD-10-CM | POA: Diagnosis not present

## 2021-02-18 DIAGNOSIS — G4733 Obstructive sleep apnea (adult) (pediatric): Secondary | ICD-10-CM | POA: Diagnosis not present

## 2021-02-24 DIAGNOSIS — R972 Elevated prostate specific antigen [PSA]: Secondary | ICD-10-CM | POA: Diagnosis not present

## 2021-02-24 DIAGNOSIS — E875 Hyperkalemia: Secondary | ICD-10-CM | POA: Diagnosis not present

## 2021-02-24 DIAGNOSIS — Z0000 Encounter for general adult medical examination without abnormal findings: Secondary | ICD-10-CM | POA: Diagnosis not present

## 2021-02-24 DIAGNOSIS — I10 Essential (primary) hypertension: Secondary | ICD-10-CM | POA: Diagnosis not present

## 2021-03-03 ENCOUNTER — Encounter: Payer: Self-pay | Admitting: Nurse Practitioner

## 2021-03-08 DIAGNOSIS — H2513 Age-related nuclear cataract, bilateral: Secondary | ICD-10-CM | POA: Diagnosis not present

## 2021-03-08 DIAGNOSIS — H20011 Primary iridocyclitis, right eye: Secondary | ICD-10-CM | POA: Diagnosis not present

## 2021-03-08 DIAGNOSIS — G2581 Restless legs syndrome: Secondary | ICD-10-CM | POA: Diagnosis not present

## 2021-03-08 DIAGNOSIS — Z0000 Encounter for general adult medical examination without abnormal findings: Secondary | ICD-10-CM | POA: Diagnosis not present

## 2021-03-08 DIAGNOSIS — E663 Overweight: Secondary | ICD-10-CM | POA: Diagnosis not present

## 2021-03-08 DIAGNOSIS — H40013 Open angle with borderline findings, low risk, bilateral: Secondary | ICD-10-CM | POA: Diagnosis not present

## 2021-03-08 DIAGNOSIS — I10 Essential (primary) hypertension: Secondary | ICD-10-CM | POA: Diagnosis not present

## 2021-03-12 ENCOUNTER — Ambulatory Visit: Payer: Medicare Other | Admitting: Nurse Practitioner

## 2021-03-12 ENCOUNTER — Ambulatory Visit: Payer: PPO

## 2021-03-12 ENCOUNTER — Other Ambulatory Visit: Payer: Self-pay

## 2021-03-12 ENCOUNTER — Encounter: Payer: Self-pay | Admitting: Nurse Practitioner

## 2021-03-12 VITALS — BP 120/70 | HR 74 | Ht 72.0 in | Wt 196.6 lb

## 2021-03-12 DIAGNOSIS — R1013 Epigastric pain: Secondary | ICD-10-CM

## 2021-03-12 DIAGNOSIS — G4733 Obstructive sleep apnea (adult) (pediatric): Secondary | ICD-10-CM

## 2021-03-12 NOTE — Progress Notes (Signed)
ASSESSMENT AND PLAN    # 70 yo male with epigastric discomfort / "fullness"  exacerbated by eating. Similar symptoms in 2015 with findings of nonpylori related gastritis and fundic gland polyps on EGD.  Symptoms resolved, now with recurrent symptoms over the last month.  Epigastric discomfort probably secondary to functional dyspepsia but it has been 7 years since evaluation so it is reasonable to proceed with EGD to rule out development of any interval gastric lesions.   -- Recent CBC, CMET normal.  --For further evaluation will scheduled for EGD.  The risks and benefits of EGD with possible biopsies were discussed with the patient who agrees to proceed.  --If EGD negative consider abdominal imaging  --If EGD is negative consider discontinuation of PPI or at least changing to H2 blocker.  Patient says that he has been on a PPI for many years ( he thinks since college) but gives no history of GERD symptoms   # Colon cancer screening. Last colonoscopy normal except for diverticulosis ( Dr. Deatra Ina). Due for 10 year screening colonoscopy March 2024.    # AAA. Echocardiogram August 2022 -moderate to severe dilation of the aortic root measuring 4.6 cm  HISTORY OF PRESENT ILLNESS     Chief Complaint : Upper abdominal pain and fullness  Ricardo Morris is a 69 y.o. male with a past medical history significant for fundic gland polyps, OSA on CPAP, HTN, AAA,  HLD See PMH below for any additional history.   Patient seen in 2015 by Dr. Deatra Ina for early satiety, fullness in chest and upper abdomen. EGD showed mild gastritis and fundic gland polyps. Pain eventually resolved, ? After after changing reflux medication to Pantoprazole. He developed epigastric fullness/discomfort about a month ago.  He cannot really recall whether the symptoms are the same as when he was evaluated by Dr. Deatra Ina in 2015.  He describes mild discomfort in epigastrium. Eating exacerbates the discomfort. Towards the end of a meal  the feels like food is stuck at top of stomach. He has the same sensation when drinking fluids.  Very infrequent NSAID use. He says he doesn't have a history of GERD yet has been on PPI therapy since college . He thinks PPI may have been started for a "nervous stomach". Weight is stable.  His stools are sometimes hard and difficult to pass, recently started a stool softener.  No blood in stool.   Patient  had syncopal episode in July 2021 which he believes was due to recent changes in BP medication.  He is followed by Cardiology ( Dr. Agustin Cree) for HTN and also AAA     Data Reviewed:  Echo Aug 2022 [place] Left ventricular ejection fraction, by estimation, is 55 to 60%. The left ventricle has normal function. The left ventricle has no regional wall motion abnormalities. Left ventricular diastolic parameters are consistent with Grade I diastolic dysfunction (impaired relaxation).   02/24/21 [facility] CBC normal.  Cr 1.06,  LFTs normal.  UA normal TSH mildly elevated 4.4  Free T4. 1.0   PREVIOUS GI EVALUATIONS:   Screening colonoscopy March 2014 --Complete exam, excellent prep --Diverticulosis   EGD Jan 2015 for dyspepsia --multiple fundic gland polyps, mild gastritis.  Surgical [P], stomach, biopsy - BENIGN GASTRIC MUCOSA. NO HELICOBACTER PYLORI, INTESTINAL METAPLASIA OR ACTIVE INFLAMMATION IDENTIFIED.    Past Medical History:  Diagnosis Date   ALLERGIC RHINITIS 05/31/2009   Qualifier: Diagnosis of  By: Annamaria Boots MD, Kasandra Knudsen  Anxiety    Ascending aortic aneurysm ([facility]) 4.6 cm in summer 2019 11/19/2018   Colon polyp    Diverticulosis of colon without hemorrhage 02/21/2013   Dyslipidemia 12/04/2019   Dyspepsia and other specified disorders of function of stomach 04/18/2013   Essential hypertension 05/27/2019   GERD (gastroesophageal reflux disease)    H/O hypertrophic cardiomyopathy 11/19/2018   No obstruction, in the summer 2018 Wall thickness  was 15 mm   Heart abnormality    slight enlarged arota- no change since previous ekg   History of kidney stones    Hypertension    Hypertensive retinopathy of both eyes 06/19/2018   INSOMNIA 05/26/2009   Qualifier: Diagnosis of  By: Annamaria Boots MD, Clinton D    Kidney stones    x1   Nuclear sclerotic cataract of both eyes 06/19/2018   OSA (obstructive sleep apnea) 05/26/2009   S/p UPPP 2006 -208lbs - RDI 13/h corrected by CPAP 7 cm     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   Posterior synechiae of iris, 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   Retinal edema 06/19/2018   RHINITIS 05/26/2009   Qualifier: Diagnosis of  By: Annamaria Boots MD, Clinton D    Sleep apnea    uses c-pap   Unspecified constipation 07/01/2013     Past Surgical History:  Procedure Laterality Date   APPENDECTOMY  1956   COLONOSCOPY     EXTRACORPOREAL SHOCK WAVE LITHOTRIPSY Left 01/25/2018   Procedure: EXTRACORPOREAL SHOCK WAVE LITHOTRIPSY (ESWL);  Surgeon: Ardis Hughs, MD;  Location: [facility];  Service: Urology;  Laterality: Left;   NASAL SINUS SURGERY     tonsillectomy     Family History  Problem Relation Age of Onset   Asthma Father    Emphysema Father    Pancreatic cancer Neg Hx    Throat cancer Neg Hx    Stomach cancer Neg Hx    Heart disease Neg Hx    Diabetes Neg Hx    Kidney disease Neg Hx    Liver disease Neg Hx    Colon cancer Neg Hx    Social History   Tobacco Use   Smoking status: Never   Smokeless tobacco: Never  Vaping Use   Vaping Use: Never used  Substance Use Topics   Alcohol use: No   Drug use: No   Current Outpatient Medications  Medication Sig Dispense Refill   amitriptyline (ELAVIL) 10 MG tablet Take 10 mg by mouth at bedtime.     amLODipine (NORVASC) 5 MG tablet Take 1 tablet (5 mg total) by mouth daily. 90 tablet 1   atorvastatin (LIPITOR) 10 MG tablet Take 1 tablet (10 mg total) by mouth daily. (Patient taking differently: Take 20 mg by mouth daily.) 90 tablet 1    Cholecalciferol (VITAMIN D3 PO) Take 1 tablet by mouth daily. Unknown strength     COENZYME Q10 PO Take 1 tablet by mouth daily. Unknown strength     loratadine (CLARITIN) 10 MG tablet Take 10 mg by mouth daily as needed for allergies.     losartan (COZAAR) 50 MG tablet Take 50 mg by mouth daily.     Multiple Vitamin (MULTIVITAMIN) tablet Take 1 tablet by mouth daily. Unknown strength     pantoprazole (PROTONIX) 40 MG tablet Take 40 mg by mouth daily.      timolol (TIMOPTIC) 0.5 % ophthalmic solution Place 1 drop into both eyes daily.     triazolam (HALCION) 0.25 MG tablet Take 0.25 mg by mouth at  bedtime and may repeat dose one time if needed.     Turmeric (QC TUMERIC COMPLEX PO) Take 1 tablet by mouth daily. Unknown strength     valACYclovir (VALTREX) 1000 MG tablet Take 1 tablet by mouth daily.     zinc gluconate 50 MG tablet Take 50 mg by mouth daily.     No current facility-administered medications for this visit.   Allergies  Allergen Reactions   Penicillins     As child     Review of Systems: Positive for allergy, sinus trouble, sleeping problems.  All other systems reviewed and negative except where noted in HPI.    PHYSICAL EXAM :    Wt Readings from Last 3 Encounters:  03/12/21 196 lb 9.6 oz (89.2 kg)  12/03/20 196 lb (88.9 kg)  11/25/20 195 lb (88.5 kg)    BP 120/70   Pulse 74   Ht 6' (1.829 m)   Wt 196 lb 9.6 oz (89.2 kg)   BMI 26.66 kg/m  Constitutional:  Generally well appearing male in no acute distress. Psychiatric: Pleasant. Normal mood and affect. Behavior is normal. EENT: Pupils normal.  Conjunctivae are normal. No scleral icterus. Neck supple.  Cardiovascular: Normal rate, regular rhythm. No edema Pulmonary/chest: Effort normal and breath sounds normal. No wheezing, rales or rhonchi. Abdominal: Soft, nondistended, nontender. Bowel sounds active throughout. There are no masses palpable. No hepatomegaly. Neurological: Alert and oriented to person place  and time. Skin: Skin is warm and dry. No rashes noted.  Tye Savoy, NP  03/12/2021, 11:34 AM

## 2021-03-12 NOTE — Patient Instructions (Signed)
You have been scheduled for an endoscopy. Please follow written instructions given to you at your visit today. If you use inhalers (even only as needed), please bring them with you on the day of your procedure.  If you are age 69 or older, your body mass index should be between 23-30. Your Body mass index is 26.66 kg/m. If this is out of the aforementioned range listed, please consider follow up with your Primary Care Provider.  If you are age 51 or younger, your body mass index should be between 19-25. Your Body mass index is 26.66 kg/m. If this is out of the aformentioned range listed, please consider follow up with your Primary Care Provider.   ________________________________________________________  The [place] [facility] providers would like to encourage you to use [facility] to communicate with providers for non-urgent requests or questions.  Due to long hold times on the telephone, sending your provider a message by [facility] may be a faster and more efficient way to get a response.  Please allow 48 business hours for a response.  Please remember that this is for non-urgent requests.  _______________________________________________________

## 2021-03-14 NOTE — Progress Notes (Signed)
Attending Physician's Attestation   I have reviewed the chart.   I agree with the Advanced Practitioner's note, impression, and recommendations with any updates as below.    Damien Cisar Mansouraty, MD [facility] Advanced Endoscopy Office # [phone]  

## 2021-03-17 ENCOUNTER — Telehealth: Payer: Self-pay | Admitting: Pulmonary Disease

## 2021-03-17 DIAGNOSIS — G4733 Obstructive sleep apnea (adult) (pediatric): Secondary | ICD-10-CM | POA: Diagnosis not present

## 2021-03-17 NOTE — Telephone Encounter (Signed)
°  HST showed very mild OSA with AHI 6/ hr Cardiac implications of mild OSA is minimal to none  Options would be no treatment or continue with CPAP.

## 2021-03-22 NOTE — Telephone Encounter (Signed)
Spoke with pt and notified of results per Dr. Alva. Pt verbalized understanding and denied any questions. 

## 2021-04-07 DIAGNOSIS — Z20822 Contact with and (suspected) exposure to covid-19: Secondary | ICD-10-CM | POA: Diagnosis not present

## 2021-04-08 ENCOUNTER — Telehealth: Payer: Self-pay | Admitting: Cardiology

## 2021-04-08 NOTE — Telephone Encounter (Signed)
Spoke with pt and he states that he is possibly having an "implant" done that would use anesthesia like they use for a colonoscopy.  Pt wanted to know if we thought he would be safe to do that.  Advised pt that our protocol requires that the office doing the procedure reach out to our office with exactly what kind of procedure is being done and what kind of anesthesia is being used.  Provided pt with fax number.  He will contact that office and have them send over a clearance.  Pt appreciative for call.

## 2021-04-08 NOTE — Telephone Encounter (Signed)
Patient wanted to talk with Dr. Agustin Cree about a procedure with sedation. Please call back

## 2021-04-13 DIAGNOSIS — G4733 Obstructive sleep apnea (adult) (pediatric): Secondary | ICD-10-CM | POA: Diagnosis not present

## 2021-04-20 ENCOUNTER — Encounter: Payer: Medicare Other | Admitting: Gastroenterology

## 2021-05-27 ENCOUNTER — Ambulatory Visit: Payer: Medicare Other | Admitting: Cardiology

## 2021-05-27 ENCOUNTER — Other Ambulatory Visit: Payer: Self-pay

## 2021-05-27 ENCOUNTER — Encounter: Payer: Self-pay | Admitting: Cardiology

## 2021-05-27 VITALS — BP 126/76 | HR 68 | Ht 72.0 in | Wt 196.0 lb

## 2021-05-27 DIAGNOSIS — I10 Essential (primary) hypertension: Secondary | ICD-10-CM

## 2021-05-27 DIAGNOSIS — E785 Hyperlipidemia, unspecified: Secondary | ICD-10-CM

## 2021-05-27 DIAGNOSIS — I7121 Aneurysm of the ascending aorta, without rupture: Secondary | ICD-10-CM

## 2021-05-27 NOTE — Patient Instructions (Signed)

## 2021-05-27 NOTE — Progress Notes (Signed)
Cardiology Office Note:    Date:  05/27/2021   ID:  Ricardo Morris, DOB [birth year], MRN [id number]  PCP:  Sueanne Margarita, DO  Cardiologist:  Jenne Campus, MD    Referring MD: Sueanne Margarita, DO   No chief complaint on file. Doing well  History of Present Illness:    Ricardo Morris is a 71 y.o. 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  Comes today to my office for follow-up overall doing well.  Denies of any chest pain tightness squeezing pressure burning chest.  Still active and exercises on the regular basis.  Past Medical History:  Diagnosis Date   ALLERGIC RHINITIS 05/31/2009   Qualifier: Diagnosis of  By: Annamaria Boots MD, [place] D    Anxiety    Ascending aortic aneurysm ([place]) 4.6 cm in summer 2019 11/19/2018   Colon polyp    Diverticulosis of colon without hemorrhage 02/21/2013   Dyslipidemia 12/04/2019   Dyspepsia and other specified disorders of function of stomach 04/18/2013   Essential hypertension 05/27/2019   GERD (gastroesophageal reflux disease)    H/O hypertrophic cardiomyopathy 11/19/2018   No obstruction, in the summer 2018 Wall thickness was 15 mm   Heart abnormality    slight enlarged arota- no change since previous ekg   History of kidney stones    Hypertension    Hypertensive retinopathy of both eyes 06/19/2018   INSOMNIA 05/26/2009   Qualifier: Diagnosis of  By: Annamaria Boots MD, Clinton D    Kidney stones    x1   Nuclear sclerotic cataract of both eyes 06/19/2018   OSA (obstructive sleep apnea) 05/26/2009   S/p UPPP 2006 -208lbs - RDI 13/h corrected by CPAP 7 cm     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   Posterior synechiae of iris, 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   Retinal edema 06/19/2018   RHINITIS 05/26/2009   Qualifier: Diagnosis of  By: Annamaria Boots MD, Clinton D    Sleep apnea    uses c-pap   Unspecified constipation 07/01/2013    Past Surgical History:   Procedure Laterality Date   APPENDECTOMY  1956   COLONOSCOPY     EXTRACORPOREAL SHOCK WAVE LITHOTRIPSY Left 01/25/2018   Procedure: EXTRACORPOREAL SHOCK WAVE LITHOTRIPSY (ESWL);  Surgeon: Ardis Hughs, MD;  Location: [facility];  Service: Urology;  Laterality: Left;   NASAL SINUS SURGERY     tonsillectomy      Current Medications: Current Meds  Medication Sig   amitriptyline (ELAVIL) 10 MG tablet Take 10 mg by mouth at bedtime.   amLODipine (NORVASC) 5 MG tablet Take 1 tablet (5 mg total) by mouth daily.   atorvastatin (LIPITOR) 20 MG tablet Take 20 mg by mouth daily.   Cholecalciferol (VITAMIN D3 PO) Take 1 tablet by mouth daily. Unknown strength   COENZYME Q10 PO Take 1 tablet by mouth daily. Unknown strength   losartan (COZAAR) 50 MG tablet Take 50 mg by mouth daily.   Multiple Vitamin (MULTIVITAMIN) tablet Take 1 tablet by mouth daily. Unknown strength   pantoprazole (PROTONIX) 40 MG tablet Take 40 mg by mouth daily.    timolol (TIMOPTIC) 0.5 % ophthalmic solution Place 1 drop into both eyes daily.   triazolam (HALCION) 0.25 MG tablet Take 0.25 mg by mouth at bedtime and may repeat dose one time if needed.   valACYclovir (VALTREX) 1000 MG tablet Take 1 tablet by mouth daily.   zinc gluconate 50 MG tablet  Take 50 mg by mouth daily.     Allergies:   Penicillins   Social History   Socioeconomic History   Marital status: Single    Spouse name: Not on file   Number of children: 1   Years of education: Not on file   Highest education level: Not on file  Occupational History   Occupation: dental lab tech  Tobacco Use   Smoking status: Never    Passive exposure: Never   Smokeless tobacco: Never  Vaping Use   Vaping Use: Never used  Substance and Sexual Activity   Alcohol use: No   Drug use: No   Sexual activity: Not on file  Other Topics Concern   Not on file  Social History Narrative   Not on file   Social Determinants of Health   Financial Resource Strain:  Not on file  Food Insecurity: Not on file  Transportation Needs: Not on file  Physical Activity: Not on file  Stress: Not on file  Social Connections: Not on file     Family History: The patient's family history includes Asthma in his father; Emphysema in his father. There is no history of Pancreatic cancer, Throat cancer, Stomach cancer, Heart disease, Diabetes, Kidney disease, Liver disease, or Colon cancer. ROS:   Please see the history of present illness.    All 14 point review of systems negative except as described per history of present illness  EKGs/Labs/Other Studies Reviewed:      Recent Labs: No results found for requested labs within last 8760 hours.  Recent Lipid Panel    Component Value Date/Time   CHOL 140 05/18/2020 0905   TRIG 94 05/18/2020 0905   HDL 41 05/18/2020 0905   CHOLHDL 3.4 05/18/2020 0905   LDLCALC 81 05/18/2020 0905    Physical Exam:    VS:  BP 126/76 (BP Location: Right Arm)    Pulse 68    Ht 6' (1.829 m)    Wt 196 lb (88.9 kg)    SpO2 97%    BMI 26.58 kg/m     Wt Readings from Last 3 Encounters:  05/27/21 196 lb (88.9 kg)  03/12/21 196 lb 9.6 oz (89.2 kg)  12/03/20 196 lb (88.9 kg)     GEN:  Well nourished, well developed in no acute distress HEENT: Normal NECK: No JVD; No carotid bruits LYMPHATICS: No lymphadenopathy CARDIAC: RRR, no murmurs, no rubs, no gallops RESPIRATORY:  Clear to auscultation without rales, wheezing or rhonchi  ABDOMEN: Soft, non-tender, non-distended MUSCULOSKELETAL:  No edema; No deformity  SKIN: Warm and dry LOWER EXTREMITIES: no swelling NEUROLOGIC:  Alert and oriented x 3 PSYCHIATRIC:  Normal affect   ASSESSMENT:    1. Aneurysm of ascending aorta without rupture   2. Essential hypertension   3. Dyslipidemia    PLAN:    In order of problems listed above:  Ascending arctic aneurysm not critical.  Continue monitoring.  Blood pressure well controlled we did discuss seizure exercises I want him to  avoid isovolumetric exercise which he. Essential hypertension blood pressure appropriately controlled continue present management. Dyslipidemia I did review his K PN which show LDL 81 HDL 41.  This is from February last year.  He thinks that his primary care physician did another blood test with just few weeks ago we will try to get a copy of it.  In the meantime we will continue with present medications   Medication Adjustments/Labs and Tests Ordered: Current medicines are reviewed at length  with the patient today.  Concerns regarding medicines are outlined above.  No orders of the defined types were placed in this encounter.  Medication changes: No orders of the defined types were placed in this encounter.   Signed, Park Liter, MD, [facility] 05/27/2021 12:46 PM    [place]

## 2021-06-23 DIAGNOSIS — S86812A Strain of other muscle(s) and tendon(s) at lower leg level, left leg, initial encounter: Secondary | ICD-10-CM | POA: Diagnosis not present

## 2021-06-23 DIAGNOSIS — W2100XA Struck by hit or thrown ball, unspecified type, initial encounter: Secondary | ICD-10-CM | POA: Diagnosis not present

## 2021-06-23 DIAGNOSIS — Z880 Allergy status to penicillin: Secondary | ICD-10-CM | POA: Diagnosis not present

## 2021-06-23 DIAGNOSIS — M79662 Pain in left lower leg: Secondary | ICD-10-CM | POA: Diagnosis not present

## 2021-06-23 DIAGNOSIS — S86912A Strain of unspecified muscle(s) and tendon(s) at lower leg level, left leg, initial encounter: Secondary | ICD-10-CM | POA: Diagnosis not present

## 2021-06-23 DIAGNOSIS — I10 Essential (primary) hypertension: Secondary | ICD-10-CM | POA: Diagnosis not present

## 2021-07-21 DIAGNOSIS — S51812A Laceration without foreign body of left forearm, initial encounter: Secondary | ICD-10-CM | POA: Diagnosis not present

## 2021-08-23 DIAGNOSIS — M545 Low back pain, unspecified: Secondary | ICD-10-CM | POA: Diagnosis not present

## 2021-08-23 DIAGNOSIS — M25552 Pain in left hip: Secondary | ICD-10-CM | POA: Diagnosis not present

## 2021-08-23 DIAGNOSIS — M5388 Other specified dorsopathies, sacral and sacrococcygeal region: Secondary | ICD-10-CM | POA: Diagnosis not present

## 2021-09-08 DIAGNOSIS — H20011 Primary iridocyclitis, right eye: Secondary | ICD-10-CM | POA: Diagnosis not present

## 2021-09-08 DIAGNOSIS — G2581 Restless legs syndrome: Secondary | ICD-10-CM | POA: Diagnosis not present

## 2021-09-08 DIAGNOSIS — I10 Essential (primary) hypertension: Secondary | ICD-10-CM | POA: Diagnosis not present

## 2021-09-08 DIAGNOSIS — E663 Overweight: Secondary | ICD-10-CM | POA: Diagnosis not present

## 2021-09-08 DIAGNOSIS — G4700 Insomnia, unspecified: Secondary | ICD-10-CM | POA: Diagnosis not present

## 2021-09-08 DIAGNOSIS — H2513 Age-related nuclear cataract, bilateral: Secondary | ICD-10-CM | POA: Diagnosis not present

## 2021-09-08 DIAGNOSIS — H40013 Open angle with borderline findings, low risk, bilateral: Secondary | ICD-10-CM | POA: Diagnosis not present

## 2021-09-14 DIAGNOSIS — Z125 Encounter for screening for malignant neoplasm of prostate: Secondary | ICD-10-CM | POA: Diagnosis not present

## 2021-09-14 DIAGNOSIS — N5201 Erectile dysfunction due to arterial insufficiency: Secondary | ICD-10-CM | POA: Diagnosis not present

## 2021-10-12 DIAGNOSIS — E7800 Pure hypercholesterolemia, unspecified: Secondary | ICD-10-CM | POA: Diagnosis not present

## 2021-10-12 DIAGNOSIS — Z8679 Personal history of other diseases of the circulatory system: Secondary | ICD-10-CM | POA: Diagnosis not present

## 2021-10-12 DIAGNOSIS — G4733 Obstructive sleep apnea (adult) (pediatric): Secondary | ICD-10-CM | POA: Diagnosis not present

## 2021-10-12 DIAGNOSIS — Z79899 Other long term (current) drug therapy: Secondary | ICD-10-CM | POA: Diagnosis not present

## 2021-10-12 DIAGNOSIS — R52 Pain, unspecified: Secondary | ICD-10-CM | POA: Diagnosis not present

## 2021-10-12 DIAGNOSIS — E785 Hyperlipidemia, unspecified: Secondary | ICD-10-CM | POA: Diagnosis not present

## 2021-10-12 DIAGNOSIS — G4700 Insomnia, unspecified: Secondary | ICD-10-CM | POA: Diagnosis not present

## 2021-10-12 DIAGNOSIS — E860 Dehydration: Secondary | ICD-10-CM | POA: Diagnosis not present

## 2021-10-12 DIAGNOSIS — I10 Essential (primary) hypertension: Secondary | ICD-10-CM | POA: Diagnosis not present

## 2021-10-12 DIAGNOSIS — K219 Gastro-esophageal reflux disease without esophagitis: Secondary | ICD-10-CM | POA: Diagnosis not present

## 2021-10-12 DIAGNOSIS — R55 Syncope and collapse: Secondary | ICD-10-CM | POA: Diagnosis not present

## 2021-10-12 DIAGNOSIS — R001 Bradycardia, unspecified: Secondary | ICD-10-CM | POA: Diagnosis not present

## 2021-10-12 DIAGNOSIS — R0902 Hypoxemia: Secondary | ICD-10-CM | POA: Diagnosis not present

## 2021-10-12 DIAGNOSIS — I351 Nonrheumatic aortic (valve) insufficiency: Secondary | ICD-10-CM | POA: Diagnosis not present

## 2021-10-12 DIAGNOSIS — I5189 Other ill-defined heart diseases: Secondary | ICD-10-CM | POA: Diagnosis not present

## 2021-10-12 DIAGNOSIS — I7121 Aneurysm of the ascending aorta, without rupture: Secondary | ICD-10-CM | POA: Diagnosis not present

## 2021-10-12 DIAGNOSIS — I77810 Thoracic aortic ectasia: Secondary | ICD-10-CM | POA: Diagnosis not present

## 2021-10-13 DIAGNOSIS — R55 Syncope and collapse: Secondary | ICD-10-CM | POA: Diagnosis not present

## 2021-10-21 DIAGNOSIS — E875 Hyperkalemia: Secondary | ICD-10-CM | POA: Diagnosis not present

## 2021-10-21 DIAGNOSIS — I712 Thoracic aortic aneurysm, without rupture, unspecified: Secondary | ICD-10-CM | POA: Diagnosis not present

## 2021-10-21 DIAGNOSIS — R55 Syncope and collapse: Secondary | ICD-10-CM | POA: Diagnosis not present

## 2021-10-21 DIAGNOSIS — I10 Essential (primary) hypertension: Secondary | ICD-10-CM | POA: Diagnosis not present

## 2021-10-28 DIAGNOSIS — N390 Urinary tract infection, site not specified: Secondary | ICD-10-CM | POA: Diagnosis not present

## 2021-11-02 DIAGNOSIS — R55 Syncope and collapse: Secondary | ICD-10-CM | POA: Diagnosis not present

## 2021-11-02 DIAGNOSIS — I10 Essential (primary) hypertension: Secondary | ICD-10-CM | POA: Diagnosis not present

## 2021-11-09 DIAGNOSIS — R8279 Other abnormal findings on microbiological examination of urine: Secondary | ICD-10-CM | POA: Diagnosis not present

## 2021-12-01 DIAGNOSIS — R55 Syncope and collapse: Secondary | ICD-10-CM | POA: Diagnosis not present

## 2021-12-01 DIAGNOSIS — I10 Essential (primary) hypertension: Secondary | ICD-10-CM | POA: Diagnosis not present

## 2021-12-01 DIAGNOSIS — K219 Gastro-esophageal reflux disease without esophagitis: Secondary | ICD-10-CM | POA: Diagnosis not present

## 2021-12-01 DIAGNOSIS — Z87442 Personal history of urinary calculi: Secondary | ICD-10-CM | POA: Diagnosis not present

## 2021-12-08 ENCOUNTER — Ambulatory Visit: Payer: Medicare Other | Admitting: Cardiology

## 2021-12-14 DIAGNOSIS — I10 Essential (primary) hypertension: Secondary | ICD-10-CM | POA: Diagnosis not present

## 2021-12-14 DIAGNOSIS — R55 Syncope and collapse: Secondary | ICD-10-CM | POA: Diagnosis not present

## 2021-12-14 DIAGNOSIS — I2510 Atherosclerotic heart disease of native coronary artery without angina pectoris: Secondary | ICD-10-CM | POA: Diagnosis not present

## 2021-12-14 DIAGNOSIS — E7800 Pure hypercholesterolemia, unspecified: Secondary | ICD-10-CM | POA: Diagnosis not present

## 2021-12-21 DIAGNOSIS — N5201 Erectile dysfunction due to arterial insufficiency: Secondary | ICD-10-CM | POA: Diagnosis not present

## 2021-12-21 DIAGNOSIS — R300 Dysuria: Secondary | ICD-10-CM | POA: Diagnosis not present

## 2022-01-07 DIAGNOSIS — I2510 Atherosclerotic heart disease of native coronary artery without angina pectoris: Secondary | ICD-10-CM | POA: Diagnosis not present

## 2022-03-02 DIAGNOSIS — R946 Abnormal results of thyroid function studies: Secondary | ICD-10-CM | POA: Diagnosis not present

## 2022-03-02 DIAGNOSIS — E785 Hyperlipidemia, unspecified: Secondary | ICD-10-CM | POA: Diagnosis not present

## 2022-03-02 DIAGNOSIS — R8290 Unspecified abnormal findings in urine: Secondary | ICD-10-CM | POA: Diagnosis not present

## 2022-03-02 DIAGNOSIS — R972 Elevated prostate specific antigen [PSA]: Secondary | ICD-10-CM | POA: Diagnosis not present

## 2022-03-15 DIAGNOSIS — Z0000 Encounter for general adult medical examination without abnormal findings: Secondary | ICD-10-CM | POA: Diagnosis not present

## 2022-03-15 DIAGNOSIS — H20011 Primary iridocyclitis, right eye: Secondary | ICD-10-CM | POA: Diagnosis not present

## 2022-03-15 DIAGNOSIS — G2581 Restless legs syndrome: Secondary | ICD-10-CM | POA: Diagnosis not present

## 2022-03-15 DIAGNOSIS — H2513 Age-related nuclear cataract, bilateral: Secondary | ICD-10-CM | POA: Diagnosis not present

## 2022-03-15 DIAGNOSIS — H40013 Open angle with borderline findings, low risk, bilateral: Secondary | ICD-10-CM | POA: Diagnosis not present

## 2022-03-15 DIAGNOSIS — E663 Overweight: Secondary | ICD-10-CM | POA: Diagnosis not present

## 2022-03-15 DIAGNOSIS — I10 Essential (primary) hypertension: Secondary | ICD-10-CM | POA: Diagnosis not present

## 2022-04-14 DIAGNOSIS — N401 Enlarged prostate with lower urinary tract symptoms: Secondary | ICD-10-CM | POA: Diagnosis not present

## 2022-04-14 DIAGNOSIS — N411 Chronic prostatitis: Secondary | ICD-10-CM | POA: Diagnosis not present

## 2022-04-19 DIAGNOSIS — K080 Exfoliation of teeth due to systemic causes: Secondary | ICD-10-CM | POA: Diagnosis not present

## 2022-05-09 DIAGNOSIS — K219 Gastro-esophageal reflux disease without esophagitis: Secondary | ICD-10-CM | POA: Diagnosis not present

## 2022-05-09 DIAGNOSIS — E785 Hyperlipidemia, unspecified: Secondary | ICD-10-CM | POA: Diagnosis not present

## 2022-05-09 DIAGNOSIS — I10 Essential (primary) hypertension: Secondary | ICD-10-CM | POA: Diagnosis not present

## 2022-05-09 DIAGNOSIS — Z880 Allergy status to penicillin: Secondary | ICD-10-CM | POA: Diagnosis not present

## 2022-05-09 DIAGNOSIS — G4733 Obstructive sleep apnea (adult) (pediatric): Secondary | ICD-10-CM | POA: Diagnosis not present

## 2022-05-09 DIAGNOSIS — K648 Other hemorrhoids: Secondary | ICD-10-CM | POA: Diagnosis not present

## 2022-05-09 DIAGNOSIS — Z79899 Other long term (current) drug therapy: Secondary | ICD-10-CM | POA: Diagnosis not present

## 2022-05-09 DIAGNOSIS — K5730 Diverticulosis of large intestine without perforation or abscess without bleeding: Secondary | ICD-10-CM | POA: Diagnosis not present

## 2022-05-09 DIAGNOSIS — Z791 Long term (current) use of non-steroidal anti-inflammatories (NSAID): Secondary | ICD-10-CM | POA: Diagnosis not present

## 2022-05-09 DIAGNOSIS — Z1211 Encounter for screening for malignant neoplasm of colon: Secondary | ICD-10-CM | POA: Diagnosis not present

## 2022-05-09 DIAGNOSIS — Z79624 Long term (current) use of inhibitors of nucleotide synthesis: Secondary | ICD-10-CM | POA: Diagnosis not present

## 2022-05-18 DIAGNOSIS — M25562 Pain in left knee: Secondary | ICD-10-CM | POA: Diagnosis not present

## 2022-05-18 DIAGNOSIS — M1712 Unilateral primary osteoarthritis, left knee: Secondary | ICD-10-CM | POA: Diagnosis not present

## 2022-08-03 DIAGNOSIS — L578 Other skin changes due to chronic exposure to nonionizing radiation: Secondary | ICD-10-CM | POA: Diagnosis not present

## 2022-08-03 DIAGNOSIS — L218 Other seborrheic dermatitis: Secondary | ICD-10-CM | POA: Diagnosis not present

## 2022-08-03 DIAGNOSIS — L821 Other seborrheic keratosis: Secondary | ICD-10-CM | POA: Diagnosis not present

## 2022-08-03 DIAGNOSIS — D485 Neoplasm of uncertain behavior of skin: Secondary | ICD-10-CM | POA: Diagnosis not present

## 2022-08-03 DIAGNOSIS — D487 Neoplasm of uncertain behavior of other specified sites: Secondary | ICD-10-CM | POA: Diagnosis not present

## 2022-08-03 DIAGNOSIS — D1801 Hemangioma of skin and subcutaneous tissue: Secondary | ICD-10-CM | POA: Diagnosis not present

## 2022-08-03 DIAGNOSIS — L720 Epidermal cyst: Secondary | ICD-10-CM | POA: Diagnosis not present

## 2022-08-23 DIAGNOSIS — D2339 Other benign neoplasm of skin of other parts of face: Secondary | ICD-10-CM | POA: Diagnosis not present

## 2022-08-26 DIAGNOSIS — G4733 Obstructive sleep apnea (adult) (pediatric): Secondary | ICD-10-CM | POA: Diagnosis not present

## 2022-08-26 DIAGNOSIS — E785 Hyperlipidemia, unspecified: Secondary | ICD-10-CM | POA: Diagnosis not present

## 2022-08-26 DIAGNOSIS — N529 Male erectile dysfunction, unspecified: Secondary | ICD-10-CM | POA: Diagnosis not present

## 2022-08-26 DIAGNOSIS — I10 Essential (primary) hypertension: Secondary | ICD-10-CM | POA: Diagnosis not present

## 2022-08-30 DIAGNOSIS — Z125 Encounter for screening for malignant neoplasm of prostate: Secondary | ICD-10-CM | POA: Diagnosis not present

## 2022-08-31 DIAGNOSIS — D487 Neoplasm of uncertain behavior of other specified sites: Secondary | ICD-10-CM | POA: Diagnosis not present

## 2022-08-31 DIAGNOSIS — D2339 Other benign neoplasm of skin of other parts of face: Secondary | ICD-10-CM | POA: Diagnosis not present

## 2022-09-02 DIAGNOSIS — I712 Thoracic aortic aneurysm, without rupture, unspecified: Secondary | ICD-10-CM | POA: Diagnosis not present

## 2022-09-02 DIAGNOSIS — I10 Essential (primary) hypertension: Secondary | ICD-10-CM | POA: Diagnosis not present

## 2022-09-02 DIAGNOSIS — G4733 Obstructive sleep apnea (adult) (pediatric): Secondary | ICD-10-CM | POA: Diagnosis not present

## 2022-09-02 DIAGNOSIS — E663 Overweight: Secondary | ICD-10-CM | POA: Diagnosis not present

## 2022-09-14 DIAGNOSIS — H2513 Age-related nuclear cataract, bilateral: Secondary | ICD-10-CM | POA: Diagnosis not present

## 2022-09-14 DIAGNOSIS — H40013 Open angle with borderline findings, low risk, bilateral: Secondary | ICD-10-CM | POA: Diagnosis not present

## 2022-09-14 DIAGNOSIS — H20011 Primary iridocyclitis, right eye: Secondary | ICD-10-CM | POA: Diagnosis not present

## 2022-09-15 DIAGNOSIS — E785 Hyperlipidemia, unspecified: Secondary | ICD-10-CM | POA: Diagnosis not present

## 2022-09-15 DIAGNOSIS — N529 Male erectile dysfunction, unspecified: Secondary | ICD-10-CM | POA: Diagnosis not present

## 2022-09-15 DIAGNOSIS — I10 Essential (primary) hypertension: Secondary | ICD-10-CM | POA: Diagnosis not present

## 2022-09-15 DIAGNOSIS — G4733 Obstructive sleep apnea (adult) (pediatric): Secondary | ICD-10-CM | POA: Diagnosis not present

## 2022-09-20 DIAGNOSIS — N411 Chronic prostatitis: Secondary | ICD-10-CM | POA: Diagnosis not present

## 2022-09-20 DIAGNOSIS — I712 Thoracic aortic aneurysm, without rupture, unspecified: Secondary | ICD-10-CM | POA: Diagnosis not present

## 2022-09-20 DIAGNOSIS — R300 Dysuria: Secondary | ICD-10-CM | POA: Diagnosis not present

## 2022-10-18 DIAGNOSIS — K080 Exfoliation of teeth due to systemic causes: Secondary | ICD-10-CM | POA: Diagnosis not present

## 2022-10-27 DIAGNOSIS — N411 Chronic prostatitis: Secondary | ICD-10-CM | POA: Diagnosis not present

## 2022-10-27 DIAGNOSIS — N401 Enlarged prostate with lower urinary tract symptoms: Secondary | ICD-10-CM | POA: Diagnosis not present

## 2022-11-03 DIAGNOSIS — I10 Essential (primary) hypertension: Secondary | ICD-10-CM | POA: Diagnosis not present

## 2022-12-13 DIAGNOSIS — H524 Presbyopia: Secondary | ICD-10-CM | POA: Diagnosis not present

## 2023-03-03 DIAGNOSIS — R972 Elevated prostate specific antigen [PSA]: Secondary | ICD-10-CM | POA: Diagnosis not present

## 2023-03-03 DIAGNOSIS — Z0000 Encounter for general adult medical examination without abnormal findings: Secondary | ICD-10-CM | POA: Diagnosis not present

## 2023-03-21 DIAGNOSIS — H2513 Age-related nuclear cataract, bilateral: Secondary | ICD-10-CM | POA: Diagnosis not present

## 2023-03-21 DIAGNOSIS — H40013 Open angle with borderline findings, low risk, bilateral: Secondary | ICD-10-CM | POA: Diagnosis not present

## 2023-03-21 DIAGNOSIS — Z1339 Encounter for screening examination for other mental health and behavioral disorders: Secondary | ICD-10-CM | POA: Diagnosis not present

## 2023-03-21 DIAGNOSIS — Z1331 Encounter for screening for depression: Secondary | ICD-10-CM | POA: Diagnosis not present

## 2023-03-21 DIAGNOSIS — I712 Thoracic aortic aneurysm, without rupture, unspecified: Secondary | ICD-10-CM | POA: Diagnosis not present

## 2023-03-21 DIAGNOSIS — Z0000 Encounter for general adult medical examination without abnormal findings: Secondary | ICD-10-CM | POA: Diagnosis not present

## 2023-04-25 DIAGNOSIS — K080 Exfoliation of teeth due to systemic causes: Secondary | ICD-10-CM | POA: Diagnosis not present

## 2023-05-10 DIAGNOSIS — I77810 Thoracic aortic ectasia: Secondary | ICD-10-CM | POA: Diagnosis not present

## 2023-05-10 DIAGNOSIS — I10 Essential (primary) hypertension: Secondary | ICD-10-CM | POA: Diagnosis not present

## 2023-05-10 DIAGNOSIS — G4733 Obstructive sleep apnea (adult) (pediatric): Secondary | ICD-10-CM | POA: Diagnosis not present

## 2023-05-10 DIAGNOSIS — E7800 Pure hypercholesterolemia, unspecified: Secondary | ICD-10-CM | POA: Diagnosis not present

## 2023-05-11 DIAGNOSIS — R310 Gross hematuria: Secondary | ICD-10-CM | POA: Diagnosis not present

## 2023-05-17 DIAGNOSIS — R310 Gross hematuria: Secondary | ICD-10-CM | POA: Diagnosis not present

## 2023-05-17 DIAGNOSIS — N400 Enlarged prostate without lower urinary tract symptoms: Secondary | ICD-10-CM | POA: Diagnosis not present

## 2023-05-17 DIAGNOSIS — R319 Hematuria, unspecified: Secondary | ICD-10-CM | POA: Diagnosis not present

## 2023-05-29 DIAGNOSIS — I351 Nonrheumatic aortic (valve) insufficiency: Secondary | ICD-10-CM | POA: Diagnosis not present

## 2023-05-29 DIAGNOSIS — I5189 Other ill-defined heart diseases: Secondary | ICD-10-CM | POA: Diagnosis not present

## 2023-05-29 DIAGNOSIS — I77810 Thoracic aortic ectasia: Secondary | ICD-10-CM | POA: Diagnosis not present

## 2023-08-03 DIAGNOSIS — L821 Other seborrheic keratosis: Secondary | ICD-10-CM | POA: Diagnosis not present

## 2023-08-03 DIAGNOSIS — L218 Other seborrheic dermatitis: Secondary | ICD-10-CM | POA: Diagnosis not present

## 2023-08-03 DIAGNOSIS — L570 Actinic keratosis: Secondary | ICD-10-CM | POA: Diagnosis not present

## 2023-08-03 DIAGNOSIS — D1801 Hemangioma of skin and subcutaneous tissue: Secondary | ICD-10-CM | POA: Diagnosis not present

## 2023-08-03 DIAGNOSIS — L578 Other skin changes due to chronic exposure to nonionizing radiation: Secondary | ICD-10-CM | POA: Diagnosis not present

## 2023-09-11 DIAGNOSIS — R972 Elevated prostate specific antigen [PSA]: Secondary | ICD-10-CM | POA: Diagnosis not present

## 2023-10-17 DIAGNOSIS — R946 Abnormal results of thyroid function studies: Secondary | ICD-10-CM | POA: Diagnosis not present

## 2023-10-17 DIAGNOSIS — R7989 Other specified abnormal findings of blood chemistry: Secondary | ICD-10-CM | POA: Diagnosis not present

## 2023-10-24 DIAGNOSIS — R1013 Epigastric pain: Secondary | ICD-10-CM | POA: Diagnosis not present

## 2023-10-24 DIAGNOSIS — K219 Gastro-esophageal reflux disease without esophagitis: Secondary | ICD-10-CM | POA: Diagnosis not present

## 2023-10-25 DIAGNOSIS — K080 Exfoliation of teeth due to systemic causes: Secondary | ICD-10-CM | POA: Diagnosis not present

## 2023-11-07 DIAGNOSIS — I77810 Thoracic aortic ectasia: Secondary | ICD-10-CM | POA: Diagnosis not present

## 2023-11-08 DIAGNOSIS — G4733 Obstructive sleep apnea (adult) (pediatric): Secondary | ICD-10-CM | POA: Diagnosis not present

## 2023-11-16 DIAGNOSIS — N401 Enlarged prostate with lower urinary tract symptoms: Secondary | ICD-10-CM | POA: Diagnosis not present

## 2023-11-16 DIAGNOSIS — N411 Chronic prostatitis: Secondary | ICD-10-CM | POA: Diagnosis not present

## 2023-11-21 DIAGNOSIS — K317 Polyp of stomach and duodenum: Secondary | ICD-10-CM | POA: Diagnosis not present

## 2023-11-21 DIAGNOSIS — K219 Gastro-esophageal reflux disease without esophagitis: Secondary | ICD-10-CM | POA: Diagnosis not present

## 2023-11-21 DIAGNOSIS — K259 Gastric ulcer, unspecified as acute or chronic, without hemorrhage or perforation: Secondary | ICD-10-CM | POA: Diagnosis not present

## 2023-11-28 DIAGNOSIS — R935 Abnormal findings on diagnostic imaging of other abdominal regions, including retroperitoneum: Secondary | ICD-10-CM | POA: Diagnosis not present

## 2023-11-28 DIAGNOSIS — K5730 Diverticulosis of large intestine without perforation or abscess without bleeding: Secondary | ICD-10-CM | POA: Diagnosis not present

## 2023-11-28 DIAGNOSIS — K429 Umbilical hernia without obstruction or gangrene: Secondary | ICD-10-CM | POA: Diagnosis not present

## 2023-11-28 DIAGNOSIS — K760 Fatty (change of) liver, not elsewhere classified: Secondary | ICD-10-CM | POA: Diagnosis not present

## 2023-12-09 DIAGNOSIS — G4733 Obstructive sleep apnea (adult) (pediatric): Secondary | ICD-10-CM | POA: Diagnosis not present

## 2023-12-20 DIAGNOSIS — K259 Gastric ulcer, unspecified as acute or chronic, without hemorrhage or perforation: Secondary | ICD-10-CM | POA: Diagnosis not present

## 2023-12-20 DIAGNOSIS — K760 Fatty (change of) liver, not elsewhere classified: Secondary | ICD-10-CM | POA: Diagnosis not present

## 2023-12-20 DIAGNOSIS — I712 Thoracic aortic aneurysm, without rupture, unspecified: Secondary | ICD-10-CM | POA: Diagnosis not present

## 2023-12-20 DIAGNOSIS — K219 Gastro-esophageal reflux disease without esophagitis: Secondary | ICD-10-CM | POA: Diagnosis not present

## 2023-12-20 DIAGNOSIS — R935 Abnormal findings on diagnostic imaging of other abdominal regions, including retroperitoneum: Secondary | ICD-10-CM | POA: Diagnosis not present

## 2024-01-25 DIAGNOSIS — R1023 Pelvic and perineal pain bilateral: Secondary | ICD-10-CM | POA: Diagnosis not present

## 2024-01-25 DIAGNOSIS — M4316 Spondylolisthesis, lumbar region: Secondary | ICD-10-CM | POA: Diagnosis not present

## 2024-01-25 DIAGNOSIS — M48061 Spinal stenosis, lumbar region without neurogenic claudication: Secondary | ICD-10-CM | POA: Diagnosis not present

## 2024-03-08 DIAGNOSIS — E785 Hyperlipidemia, unspecified: Secondary | ICD-10-CM | POA: Diagnosis not present

## 2024-03-08 DIAGNOSIS — Z0000 Encounter for general adult medical examination without abnormal findings: Secondary | ICD-10-CM | POA: Diagnosis not present
